# Patient Record
Sex: Female | Born: 1980 | Race: White | Hispanic: No | Marital: Married | Smoking: Current every day smoker
Health system: Southern US, Community
[De-identification: ages and names within clinical notes are randomized; demographics above are authoritative.]

## PROBLEM LIST (undated history)

## (undated) DIAGNOSIS — F419 Anxiety disorder, unspecified: Secondary | ICD-10-CM

## (undated) DIAGNOSIS — Z8489 Family history of other specified conditions: Secondary | ICD-10-CM

## (undated) DIAGNOSIS — J45909 Unspecified asthma, uncomplicated: Secondary | ICD-10-CM

## (undated) DIAGNOSIS — R011 Cardiac murmur, unspecified: Secondary | ICD-10-CM

## (undated) DIAGNOSIS — O1490 Unspecified pre-eclampsia, unspecified trimester: Secondary | ICD-10-CM

## (undated) DIAGNOSIS — D649 Anemia, unspecified: Secondary | ICD-10-CM

## (undated) DIAGNOSIS — K219 Gastro-esophageal reflux disease without esophagitis: Secondary | ICD-10-CM

---

## 2015-08-19 ENCOUNTER — Encounter: Payer: Self-pay | Admitting: Emergency Medicine

## 2015-08-19 ENCOUNTER — Emergency Department: Payer: 59

## 2015-08-19 ENCOUNTER — Emergency Department
Admission: EM | Admit: 2015-08-19 | Discharge: 2015-08-19 | Disposition: A | Discharge status: Home / Self Care | Payer: 59 | Attending: Emergency Medicine | Admitting: Emergency Medicine

## 2015-08-19 DIAGNOSIS — J069 Acute upper respiratory infection, unspecified: Secondary | ICD-10-CM | POA: Insufficient documentation

## 2015-08-19 DIAGNOSIS — J4521 Mild intermittent asthma with (acute) exacerbation: Secondary | ICD-10-CM

## 2015-08-19 DIAGNOSIS — J45901 Unspecified asthma with (acute) exacerbation: Secondary | ICD-10-CM | POA: Insufficient documentation

## 2015-08-19 DIAGNOSIS — Z720 Tobacco use: Secondary | ICD-10-CM | POA: Insufficient documentation

## 2015-08-19 DIAGNOSIS — R062 Wheezing: Secondary | ICD-10-CM | POA: Diagnosis present

## 2015-08-19 HISTORY — DX: Unspecified asthma, uncomplicated: J45.909

## 2015-08-19 HISTORY — DX: Unspecified pre-eclampsia, unspecified trimester: O14.90

## 2015-08-19 MED ORDER — HYDROCOD POLST-CPM POLST ER 10-8 MG/5ML PO SUER
ORAL | Status: AC
  Administered 2015-08-19: 5 mL via ORAL
  Filled 2015-08-19: qty 5

## 2015-08-19 MED ORDER — PREDNISONE 50 MG PO TABS: ORAL_TABLET | ORAL | Status: DC

## 2015-08-19 MED ORDER — PREDNISONE 20 MG PO TABS
60.0000 mg | ORAL_TABLET | Freq: Once | ORAL | Status: AC
  Administered 2015-08-19: 60 mg via ORAL

## 2015-08-19 MED ORDER — PREDNISONE 20 MG PO TABS
ORAL_TABLET | ORAL | Status: AC
  Administered 2015-08-19: 60 mg via ORAL
  Filled 2015-08-19: qty 3

## 2015-08-19 MED ORDER — IPRATROPIUM-ALBUTEROL 0.5-2.5 (3) MG/3ML IN SOLN
3.0000 mL | Freq: Once | RESPIRATORY_TRACT | Status: AC
  Administered 2015-08-19: 3 mL via RESPIRATORY_TRACT

## 2015-08-19 MED ORDER — HYDROCOD POLST-CPM POLST ER 10-8 MG/5ML PO SUER
5.0000 mL | Freq: Once | ORAL | Status: AC
  Administered 2015-08-19: 5 mL via ORAL

## 2015-08-19 MED ORDER — HYDROCOD POLST-CPM POLST ER 10-8 MG/5ML PO SUER: 5.0000 mL | Freq: Two times a day (BID) | ORAL | Status: DC

## 2015-08-19 MED ORDER — IPRATROPIUM-ALBUTEROL 0.5-2.5 (3) MG/3ML IN SOLN
RESPIRATORY_TRACT | Status: AC
  Administered 2015-08-19: 3 mL via RESPIRATORY_TRACT
  Filled 2015-08-19: qty 9

## 2015-08-19 NOTE — Discharge Instructions (Signed)
Asthma, Adult °Asthma is a recurring condition in which the airways tighten and narrow. Asthma can make it difficult to breathe. It can cause coughing, wheezing, and shortness of breath. Asthma episodes, also called asthma attacks, range from minor to life-threatening. Asthma cannot be cured, but medicines and lifestyle changes can help control it. °CAUSES °Asthma is believed to be caused by inherited (genetic) and environmental factors, but its exact cause is unknown. Asthma may be triggered by allergens, lung infections, or irritants in the air. Asthma triggers are different for each person. Common triggers include:  °· Animal dander. °· Dust mites. °· Cockroaches. °· Pollen from trees or grass. °· Mold. °· Smoke. °· Air pollutants such as dust, household cleaners, hair sprays, aerosol sprays, paint fumes, strong chemicals, or strong odors. °· Cold air, weather changes, and winds (which increase molds and pollens in the air). °· Strong emotional expressions such as crying or laughing hard. °· Stress. °· Certain medicines (such as aspirin) or types of drugs (such as beta-blockers). °· Sulfites in foods and drinks. Foods and drinks that may contain sulfites include dried fruit, potato chips, and sparkling grape juice. °· Infections or inflammatory conditions such as the flu, a cold, or an inflammation of the nasal membranes (rhinitis). °· Gastroesophageal reflux disease (GERD). °· Exercise or strenuous activity. °SYMPTOMS °Symptoms may occur immediately after asthma is triggered or many hours later. Symptoms include: °· Wheezing. °· Excessive nighttime or early morning coughing. °· Frequent or severe coughing with a common cold. °· Chest tightness. °· Shortness of breath. °DIAGNOSIS  °The diagnosis of asthma is made by a review of your medical history and a physical exam. Tests may also be performed. These may include: °· Lung function studies. These tests show how much air you breathe in and out. °· Allergy  tests. °· Imaging tests such as X-rays. °TREATMENT  °Asthma cannot be cured, but it can usually be controlled. Treatment involves identifying and avoiding your asthma triggers. It also involves medicines. There are 2 classes of medicine used for asthma treatment:  °· Controller medicines. These prevent asthma symptoms from occurring. They are usually taken every day. °· Reliever or rescue medicines. These quickly relieve asthma symptoms. They are used as needed and provide short-term relief. °Your health care provider will help you create an asthma action plan. An asthma action plan is a written plan for managing and treating your asthma attacks. It includes a list of your asthma triggers and how they may be avoided. It also includes information on when medicines should be taken and when their dosage should be changed. An action plan may also involve the use of a device called a peak flow meter. A peak flow meter measures how well the lungs are working. It helps you monitor your condition. °HOME CARE INSTRUCTIONS  °· Take medicines only as directed by your health care provider. Speak with your health care provider if you have questions about how or when to take the medicines. °· Use a peak flow meter as directed by your health care provider. Record and keep track of readings. °· Understand and use the action plan to help minimize or stop an asthma attack without needing to seek medical care. °· Control your home environment in the following ways to help prevent asthma attacks: °¨ Do not smoke. Avoid being exposed to secondhand smoke. °¨ Change your heating and air conditioning filter regularly. °¨ Limit your use of fireplaces and wood stoves. °¨ Get rid of pests (such as roaches   and mice) and their droppings. °¨ Throw away plants if you see mold on them. °¨ Clean your floors and dust regularly. Use unscented cleaning products. °¨ Try to have someone else vacuum for you regularly. Stay out of rooms while they are  being vacuumed and for a short while afterward. If you vacuum, use a dust mask from a hardware store, a double-layered or microfilter vacuum cleaner bag, or a vacuum cleaner with a HEPA filter. °¨ Replace carpet with wood, tile, or vinyl flooring. Carpet can trap dander and dust. °¨ Use allergy-proof pillows, mattress covers, and box spring covers. °¨ Wash bed sheets and blankets every week in hot water and dry them in a dryer. °¨ Use blankets that are made of polyester or cotton. °¨ Clean bathrooms and kitchens with bleach. If possible, have someone repaint the walls in these rooms with mold-resistant paint. Keep out of the rooms that are being cleaned and painted. °¨ Wash hands frequently. °SEEK MEDICAL CARE IF:  °· You have wheezing, shortness of breath, or a cough even if taking medicine to prevent attacks. °· The colored mucus you cough up (sputum) is thicker than usual. °· Your sputum changes from clear or white to yellow, green, gray, or bloody. °· You have any problems that may be related to the medicines you are taking (such as a rash, itching, swelling, or trouble breathing). °· You are using a reliever medicine more than 2-3 times per week. °· Your peak flow is still at 50-79% of your personal best after following your action plan for 1 hour. °· You have a fever. °SEEK IMMEDIATE MEDICAL CARE IF:  °· You seem to be getting worse and are unresponsive to treatment during an asthma attack. °· You are short of breath even at rest. °· You get short of breath when doing very little physical activity. °· You have difficulty eating, drinking, or talking due to asthma symptoms. °· You develop chest pain. °· You develop a fast heartbeat. °· You have a bluish color to your lips or fingernails. °· You are light-headed, dizzy, or faint. °· Your peak flow is less than 50% of your personal best. °  °This information is not intended to replace advice given to you by your health care provider. Make sure you discuss any  questions you have with your health care provider. °  °Document Released: 11/01/2005 Document Revised: 07/23/2015 Document Reviewed: 05/31/2013 °Elsevier Interactive Patient Education ©2016 [employer] °Upper Respiratory Infection, Adult °Most upper respiratory infections (URIs) are caused by a virus. A URI affects the nose, throat, and upper air passages. The most common type of URI is often called "the common cold." °HOME CARE  °· Take medicines only as told by your doctor. °· Gargle warm saltwater or take cough drops to comfort your throat as told by your doctor. °· Use a warm mist humidifier or inhale steam from a shower to increase air moisture. This may make it easier to breathe. °· Drink enough fluid to keep your pee (urine) clear or pale yellow. °· Eat soups and other clear broths. °· Have a healthy diet. °· Rest as needed. °· Go back to work when your fever is gone or your doctor says it is okay. °¨ You may need to stay home longer to avoid giving your URI to others. °¨ You can also wear a face mask and wash your hands often to prevent spread of the virus. °· Use your inhaler more if you have asthma. °· Do   not use any tobacco products, including cigarettes, chewing tobacco, or electronic cigarettes. If you need help quitting, ask your doctor. °GET HELP IF: °· You are getting worse, not better. °· Your symptoms are not helped by medicine. °· You have chills. °· You are getting more short of breath. °· You have brown or red mucus. °· You have yellow or brown discharge from your nose. °· You have pain in your face, especially when you bend forward. °· You have a fever. °· You have puffy (swollen) neck glands. °· You have pain while swallowing. °· You have white areas in the back of your throat. °GET HELP RIGHT AWAY IF:  °· You have very bad or constant: °¨ Headache. °¨ Ear pain. °¨ Pain in your forehead, behind your eyes, and over your cheekbones (sinus pain). °¨ Chest pain. °· You have long-lasting  (chronic) lung disease and any of the following: °¨ Wheezing. °¨ Long-lasting cough. °¨ Coughing up blood. °¨ A change in your usual mucus. °· You have a stiff neck. °· You have changes in your: °¨ Vision. °¨ Hearing. °¨ Thinking. °¨ Mood. °MAKE SURE YOU:  °· Understand these instructions. °· Will watch your condition. °· Will get help right away if you are not doing well or get worse. °  °This information is not intended to replace advice given to you by your health care provider. Make sure you discuss any questions you have with your health care provider. °  °Document Released: 04/19/2008 Document Revised: 03/18/2015 Document Reviewed: 02/06/2014 °Elsevier Interactive Patient Education ©2016 [employer] ° °

## 2015-08-19 NOTE — ED Provider Notes (Signed)
[facility] Emergency Department Provider Note     Time seen: ----------------------------------------- 9:49 PM on 08/19/2015 -----------------------------------------    I have reviewed the triage vital signs and the nursing notes.   HISTORY  Chief Complaint Wheezing and Cough    HPI Tanya Watts is a 34 y.o. female who presents to ER for asthma exacerbation. Patient's had recent cold symptoms with occasional productive cough and clear right sputum. She saw her primary care doctor today and was told she was having wheezing, was given Flovent today to use with Ventolin but she continues to have wheezing. She denies any other complaints.   Past Medical History  Diagnosis Date  . Asthma   . Preeclampsia     There are no active problems to display for this patient.   History reviewed. No pertinent past surgical history.  Allergies Review of patient's allergies indicates no known allergies.  Social History Social History  Substance Use Topics  . Smoking status: Current Every Day Smoker -- 2.00 packs/day    Types: Cigarettes  . Smokeless tobacco: None  . Alcohol Use: No    Review of Systems Constitutional: Negative for fever. Eyes: Negative for visual changes. ENT: Negative for sore throat. Cardiovascular: Negative for chest pain. Respiratory: Positive shortness of breath and cough. Gastrointestinal: Negative for abdominal pain, vomiting and diarrhea. Genitourinary: Negative for dysuria. Musculoskeletal: Negative for back pain. Skin: Negative for rash. Neurological: Negative for headaches, focal weakness or numbness.  10-point ROS otherwise negative.  ____________________________________________   PHYSICAL EXAM:  VITAL SIGNS: ED Triage Vitals  Enc Vitals Group     BP 08/19/15 2134 153/96 mmHg     Pulse Rate 08/19/15 2134 118     Resp 08/19/15 2134 20     Temp 08/19/15 2134 98.5 F (36.9 C)     Temp Source 08/19/15 2134  Oral     SpO2 08/19/15 2134 95 %     Weight 08/19/15 2134 184 lb (83.462 kg)     Height 08/19/15 2134  (1.676 m)     Head Cir --      Peak Flow --      Pain Score --      Pain Loc --      Pain Edu? --      Excl. in GC? --     Constitutional: Alert and oriented. Well appearing and in no distress. Eyes: Conjunctivae are normal. PERRL. Normal extraocular movements. ENT   Head: Normocephalic and atraumatic.   Nose: No congestion/rhinnorhea.   Mouth/Throat: Mucous membranes are moist.   Neck: No stridor. Cardiovascular: Normal rate, regular rhythm. Normal and symmetric distal pulses are present in all extremities. No murmurs, rubs, or gallops. Respiratory: Normal respiratory effort without tachypnea nor retractions. Bilateral wheezing is noted. Gastrointestinal: Soft and nontender. No distention. No abdominal bruits.  Musculoskeletal: Nontender with normal range of motion in all extremities. No joint effusions.  No lower extremity tenderness nor edema. Neurologic:  Normal speech and language. No gross focal neurologic deficits are appreciated. Speech is normal. No gait instability. Skin:  Skin is warm, dry and intact. No rash noted. Psychiatric: Mood and affect are normal. Speech and behavior are normal. Patient exhibits appropriate insight and judgment.  ____________________________________________  ED COURSE:  Pertinent labs & imaging results that were available during my care of the patient were reviewed by me and considered in my medical decision making (see chart for details). Patient being given duo nebs and oral prednisone here. Will reevaluate after  same. ____________________________________________    RADIOLOGY Images were viewed by me  Chest x-ray Is unremarkable ____________________________________________  FINAL ASSESSMENT AND PLAN  Acute asthma exacerbation  Plan: Patient with labs and imaging as dictated above. Likely URI with asthma  exacerbation. She'll continue on prednisone, we'll prescribe Tussionex to take as needed.   Emily Filbert, MD   Emily Filbert, MD 08/19/15 [phone]

## 2015-08-19 NOTE — ED Notes (Addendum)
Patient ambulatory to triage with steady gait, without difficulty or distress noted; pt reports hx asthma; recent cold symptoms with occas productive cough clear/white sputum; seen PCP today and told that she was having wheezing rx flovent today by PCP to use with ventolin but continues to have wheezing; resp even/unlab with diminished breath sounds and occas exp wheeze noted

## 2015-08-21 ENCOUNTER — Emergency Department
Admission: EM | Admit: 2015-08-21 | Discharge: 2015-08-21 | Discharge status: Against Medical Advice | Payer: 59 | Attending: Emergency Medicine | Admitting: Emergency Medicine

## 2015-08-21 ENCOUNTER — Encounter: Payer: Self-pay | Admitting: Emergency Medicine

## 2015-08-21 DIAGNOSIS — J45901 Unspecified asthma with (acute) exacerbation: Secondary | ICD-10-CM | POA: Diagnosis not present

## 2015-08-21 DIAGNOSIS — Z720 Tobacco use: Secondary | ICD-10-CM | POA: Insufficient documentation

## 2015-08-21 DIAGNOSIS — R0602 Shortness of breath: Secondary | ICD-10-CM | POA: Diagnosis present

## 2015-08-21 NOTE — ED Notes (Signed)
Pt came in [facility] on 5/3 for this same shortness of breath and was given  prednisone, as well as nebulizer treatment which worked very well for her.  At home, she uses rescue inhaler but feels like her airway is tight about an hour after.  She does have a nebulizer on order but does not have it at her home yet.

## 2015-11-19 ENCOUNTER — Emergency Department
Admission: EM | Admit: 2015-11-19 | Discharge: 2015-11-19 | Disposition: A | Discharge status: Home / Self Care | Payer: 59 | Attending: Emergency Medicine | Admitting: Emergency Medicine

## 2015-11-19 ENCOUNTER — Encounter: Payer: Self-pay | Admitting: Emergency Medicine

## 2015-11-19 DIAGNOSIS — F17210 Nicotine dependence, cigarettes, uncomplicated: Secondary | ICD-10-CM | POA: Insufficient documentation

## 2015-11-19 DIAGNOSIS — J4521 Mild intermittent asthma with (acute) exacerbation: Secondary | ICD-10-CM | POA: Diagnosis not present

## 2015-11-19 DIAGNOSIS — Z7952 Long term (current) use of systemic steroids: Secondary | ICD-10-CM | POA: Diagnosis not present

## 2015-11-19 DIAGNOSIS — Z79899 Other long term (current) drug therapy: Secondary | ICD-10-CM | POA: Diagnosis not present

## 2015-11-19 DIAGNOSIS — J300 Vasomotor rhinitis: Secondary | ICD-10-CM | POA: Insufficient documentation

## 2015-11-19 DIAGNOSIS — R05 Cough: Secondary | ICD-10-CM | POA: Diagnosis present

## 2015-11-19 MED ORDER — IPRATROPIUM-ALBUTEROL 0.5-2.5 (3) MG/3ML IN SOLN
3.0000 mL | Freq: Once | RESPIRATORY_TRACT | Status: AC
  Administered 2015-11-19: 3 mL via RESPIRATORY_TRACT

## 2015-11-19 MED ORDER — FLUTICASONE PROPIONATE 50 MCG/ACT NA SUSP: 1.0000 | Freq: Every day | NASAL | Status: DC

## 2015-11-19 MED ORDER — IPRATROPIUM-ALBUTEROL 0.5-2.5 (3) MG/3ML IN SOLN
RESPIRATORY_TRACT | Status: AC
  Administered 2015-11-19: 3 mL via RESPIRATORY_TRACT
  Filled 2015-11-19: qty 3

## 2015-11-19 NOTE — ED Provider Notes (Signed)
[facility] Emergency Department Provider Note ____________________________________________  Time seen: 0716  I have reviewed the triage vital signs and the nursing notes.  HISTORY  Chief Complaint  Asthma  HPI Tanya Watts is a 35 y.o. female presents to the ED for an acute flare of her asthma with onset this morning while asleep. She describes that about 4:45 AM she developed a nonproductive cough and shortness of breath. She denies any interim admit to fevers, chills, or sweats. She also denies any recent upper respiratory symptoms. She describes symptoms are not improved with a single dose of her albuterol inhaler this morning.  Past Medical History  Diagnosis Date  . Asthma   . Preeclampsia     There are no active problems to display for this patient.   History reviewed. No pertinent past surgical history.  Current Outpatient Rx  Name  Route  Sig  Dispense  Refill  . citalopram (CELEXA) 20 MG tablet   Oral   Take 20 mg by mouth daily.         Marland Kitchen albuterol (PROVENTIL HFA;VENTOLIN HFA) 108 (90 BASE) MCG/ACT inhaler   Inhalation   Inhale 2 puffs into the lungs every 4 (four) hours as needed for wheezing or shortness of breath.         . chlorpheniramine-HYDROcodone (TUSSIONEX PENNKINETIC ER) 10-8 MG/5ML SUER   Oral   Take 5 mLs by mouth 2 (two) times daily.   140 mL   0   . fluticasone (FLONASE) 50 MCG/ACT nasal spray   Each Nare   Place 1 spray into both nostrils daily.   16 g   0   . fluticasone (FLOVENT HFA) 220 MCG/ACT inhaler   Inhalation   Inhale 2 puffs into the lungs 2 (two) times daily.         Marland Kitchen loratadine (CLARITIN) 10 MG tablet   Oral   Take 10 mg by mouth daily.         . predniSONE (DELTASONE) 50 MG tablet      One tablet by mouth daily   4 tablet   0    Allergies Review of patient's allergies indicates no known allergies.  No family history on file.  Social History Social History  Substance Use  Topics  . Smoking status: Current Every Day Smoker -- 1.00 packs/day    Types: Cigarettes  . Smokeless tobacco: None  . Alcohol Use: No   Review of Systems  Constitutional: Negative for fever. Eyes: Negative for visual changes. ENT: Negative for sore throat. Cardiovascular: Negative for chest pain. Respiratory: Positive for shortness of breath. Gastrointestinal: Negative for abdominal pain, vomiting and diarrhea. Genitourinary: Negative for dysuria. Musculoskeletal: Negative for back pain. Skin: Negative for rash. Neurological: Negative for headaches, focal weakness or numbness. ____________________________________________  PHYSICAL EXAM:  VITAL SIGNS: ED Triage Vitals  Enc Vitals Group     BP 11/19/15 0620 166/116 mmHg     Pulse Rate 11/19/15 0620 113     Resp 11/19/15 0620 18     Temp 11/19/15 0620 97.8 F (36.6 C)     Temp Source 11/19/15 0620 Oral     SpO2 11/19/15 0620 100 %     Weight 11/19/15 0620 194 lb (87.998 kg)     Height 11/19/15 0620 5\' 6"  (1.676 m)     Head Cir --      Peak Flow --      Pain Score --      Pain Loc --  Pain Edu? --      Excl. in GC? --    Constitutional: Alert and oriented. Well appearing and in no distress. Head: Normocephalic and atraumatic.      Eyes: Conjunctivae are normal. PERRL. Normal extraocular movements      Ears: Canals clear. TMs intact bilaterally.   Nose: No congestion. Mild rhinorrhea.   Mouth/Throat: Mucous membranes are moist.   Neck: Supple. No thyromegaly. Hematological/Lymphatic/Immunological: No cervical lymphadenopathy. Cardiovascular: Normal rate, regular rhythm.  Respiratory: Normal respiratory effort. No wheezes/rales/rhonchi. Gastrointestinal: Soft and nontender. No distention. Musculoskeletal: Nontender with normal range of motion in all extremities.  Neurologic:  Normal gait without ataxia. Normal speech and language. No gross focal neurologic deficits are appreciated. Skin:  Skin is warm,  dry and intact. No rash noted. Psychiatric: Mood and affect are normal. Patient exhibits appropriate insight and judgment. ____________________________________________  PROCEDURES  DuoNeb x 1 ____________________________________________  INITIAL IMPRESSION / ASSESSMENT AND PLAN / ED COURSE  Patient with an acute vasomotor rhinitis and flare of mild intermittent asthma. Her symptoms may be more consistent with postnasal drainage then out right bronchospasm. Patient is however encouraged to dose her albuterol every 15 minutes as needed for acute flares. She is also advised to dose over-the-counter allergy medicines as needed. She'll follow with primary care provider for ongoing symptoms. ____________________________________________  FINAL CLINICAL IMPRESSION(S) / ED DIAGNOSES  Final diagnoses:  Vasomotor rhinitis  Acute asthma flare, mild intermittent      Lissa HoardJenise V Bacon Odell Choung, PA-C 11/19/15 [phone]  Minna AntisKevin Paduchowski, MD 11/19/15 1515

## 2015-11-19 NOTE — ED Notes (Signed)
Pt lungs clear, reports feeling better after receiving nebulizer.  Educated patient on smoking cessation and proper inhaler use. Encouraged to follow up with PCP.

## 2015-11-19 NOTE — ED Notes (Addendum)
Patient ambulatory to triage with steady gait, without difficulty or distress noted; pt reports awoke with asthma attack, nonprod cough; used inhaler (albuterol) with only slight relief; denies any recent illness; resp even/unlab, lungs clear

## 2015-11-19 NOTE — Discharge Instructions (Signed)
Allergic Rhinitis Allergic rhinitis is when the mucous membranes in the nose respond to allergens. Allergens are particles in the air that cause your body to have an allergic reaction. This causes you to release allergic antibodies. Through a chain of events, these eventually cause you to release histamine into the blood stream. Although meant to protect the body, it is this release of histamine that causes your discomfort, such as frequent sneezing, congestion, and an itchy, runny nose.  CAUSES Seasonal allergic rhinitis (hay fever) is caused by pollen allergens that may come from grasses, trees, and weeds. Year-round allergic rhinitis (perennial allergic rhinitis) is caused by allergens such as house dust mites, pet dander, and mold spores. SYMPTOMS  Nasal stuffiness (congestion).  Itchy, runny nose with sneezing and tearing of the eyes. DIAGNOSIS Your health care provider can help you determine the allergen or allergens that trigger your symptoms. If you and your health care provider are unable to determine the allergen, skin or blood testing may be used. Your health care provider will diagnose your condition after taking your health history and performing a physical exam. Your health care provider may assess you for other related conditions, such as asthma, pink eye, or an ear infection. TREATMENT Allergic rhinitis does not have a cure, but it can be controlled by:  Medicines that block allergy symptoms. These may include allergy shots, nasal sprays, and oral antihistamines.  Avoiding the allergen. Hay fever may often be treated with antihistamines in pill or nasal spray forms. Antihistamines block the effects of histamine. There are over-the-counter medicines that may help with nasal congestion and swelling around the eyes. Check with your health care provider before taking or giving this medicine. If avoiding the allergen or the medicine prescribed do not work, there are many new medicines  your health care provider can prescribe. Stronger medicine may be used if initial measures are ineffective. Desensitizing injections can be used if medicine and avoidance does not work. Desensitization is when a patient is given ongoing shots until the body becomes less sensitive to the allergen. Make sure you follow up with your health care provider if problems continue. HOME CARE INSTRUCTIONS It is not possible to completely avoid allergens, but you can reduce your symptoms by taking steps to limit your exposure to them. It helps to know exactly what you are allergic to so that you can avoid your specific triggers. SEEK MEDICAL CARE IF:  You have a fever.  You develop a cough that does not stop easily (persistent).  You have shortness of breath.  You start wheezing.  Symptoms interfere with normal daily activities.   This information is not intended to replace advice given to you by your health care provider. Make sure you discuss any questions you have with your health care provider.   Document Released: 07/27/2001 Document Revised: 11/22/2014 Document Reviewed: 07/09/2013 Elsevier Interactive Patient Education 2016 [place] Prevention While you may not be able to control the fact that you have asthma, you can take actions to prevent asthma attacks. The best way to prevent asthma attacks is to maintain good control of your asthma. You can achieve this by:  Taking your medicines as directed.  Avoiding things that can irritate your airways or make your asthma symptoms worse (asthma triggers).  Keeping track of how well your asthma is controlled and of any changes in your symptoms.  Responding quickly to worsening asthma symptoms (asthma attack).  Seeking emergency care when it is needed. WHAT  ARE SOME WAYS TO PREVENT AN ASTHMA ATTACK? Have a Plan Work with your health care provider to create a written plan for managing and treating your asthma attacks (asthma  action plan). This plan includes:  A list of your asthma triggers and how you can avoid them.  Information on when medicines should be taken and when their dosages should be changed.  The use of a device that measures how well your lungs are working (peak flow meter). Monitor Your Asthma Use your peak flow meter and record your results in a journal every day. A drop in your peak flow numbers on one or more days may indicate the start of an asthma attack. This can happen even before you start to feel symptoms. You can prevent an asthma attack from getting worse by following the steps in your asthma action plan. Avoid Asthma Triggers Work with your asthma health care provider to find out what your asthma triggers are. This can be done by:  Allergy testing.  Keeping a journal that notes when asthma attacks occur and the factors that may have contributed to them.  Determining if there are other medical conditions that are making your asthma worse. Once you have determined your asthma triggers, take steps to avoid them. This may include avoiding excessive or prolonged exposure to:  Dust. Have someone dust and vacuum your home for you once or twice a week. Using a high-efficiency particulate arrestance (HEPA) vacuum is best.  Smoke. This includes campfire smoke, forest fire smoke, and secondhand smoke from tobacco products.  Pet dander. Avoid contact with animals that you know you are allergic to.  Allergens from trees, grasses or pollens. Avoid spending a lot of time outdoors when pollen counts are high, and on very windy days.  Very cold, dry, or humid air.  Mold.  Foods that contain high amounts of sulfites.  Strong odors.  Outdoor air pollutants, such as [employer]ic[employer].  Indoor air pollutants, such as aerosol sprays and fumes from household cleaners.  Household pests, including dust mites and cockroaches, and pest droppings.  Certain medicines, including NSAIDs. Always talk to  your health care provider before stopping or starting any new medicines. Medicines Take over-the-counter and prescription medicines only as told by your health care provider. Many asthma attacks can be prevented by carefully following your medicine schedule. Taking your medicines correctly is especially important when you cannot avoid certain asthma triggers. Act Quickly If an asthma attack does happen, acting quickly can decrease how severe it is and how long it lasts. Take these steps:   Pay attention to your symptoms. If you are coughing, wheezing, or having difficulty breathing, do not wait to see if your symptoms go away on their own. Follow your asthma action plan.  If you have followed your asthma action plan and your symptoms are not improving, call your health care provider or seek immediate medical care at the nearest hospital. It is important to note how often you need to use your fast-acting rescue inhaler. If you are using your rescue inhaler more often, it may mean that your asthma is not under control. Adjusting your asthma treatment plan may help you to prevent future asthma attacks and help you to gain better control of your condition. HOW CAN I PREVENT AN ASTHMA ATTACK WHEN I EXERCISE? Follow advice from your health care provider about whether you should use your fast-acting inhaler before exercising. Many people with asthma experience exercise-induced bronchoconstriction (EIB). This condition often worsens during  vigorous exercise in cold, humid, or dry environments. Usually, people with EIB can stay very active by pre-treating with a fast-acting inhaler before exercising.   This information is not intended to replace advice given to you by your health care provider. Make sure you discuss any questions you have with your health care provider.   Document Released: 10/20/2009 Document Revised: 07/23/2015 Document Reviewed: 04/03/2015 Elsevier Interactive Patient Education 2016  [employer]  Continue to dose your home medicines for asthma and allergy. Dose your albuterol inhaler every 15 minutes for an acute attack.

## 2015-11-19 NOTE — ED Notes (Signed)
Pt took inhaler around 445 this morning and did not feel much relief. Pt has history of asthma. Denies any recent URI or other illness. Has been able to cough up some since using inhaler, but not a lot per patient.

## 2017-06-15 IMAGING — CR DG CHEST 2V
1 series · 2 of 2 positions shown · non-contrast
Comparison: None.

CLINICAL DATA: Acute onset of generalized chest tightness and
cough. Initial encounter.

EXAM:
CHEST  2 VIEW

[Series 1: dg chest 2 view · 0.14mm/px · 2 of 2 slices shown]
[im 1/2]
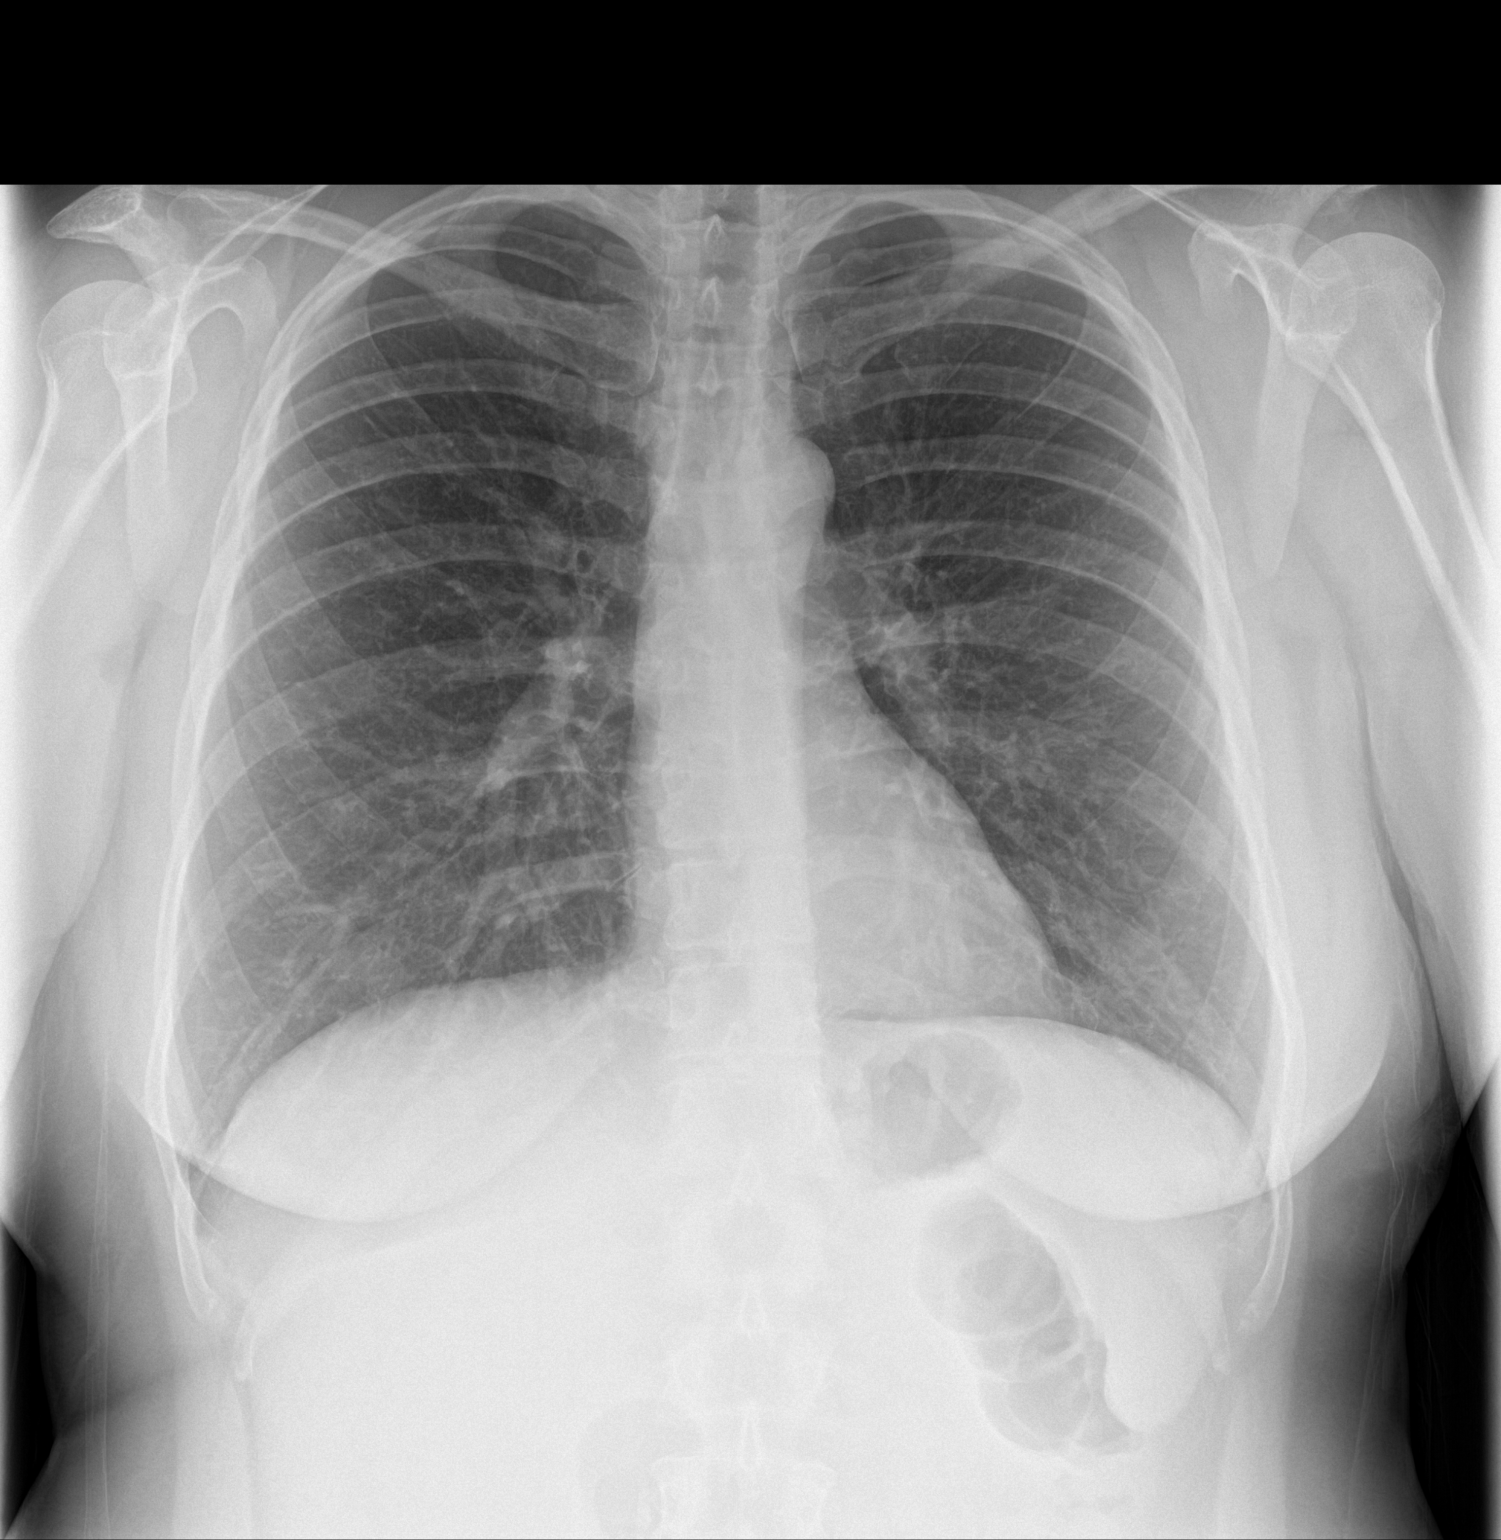
[im 2/2]
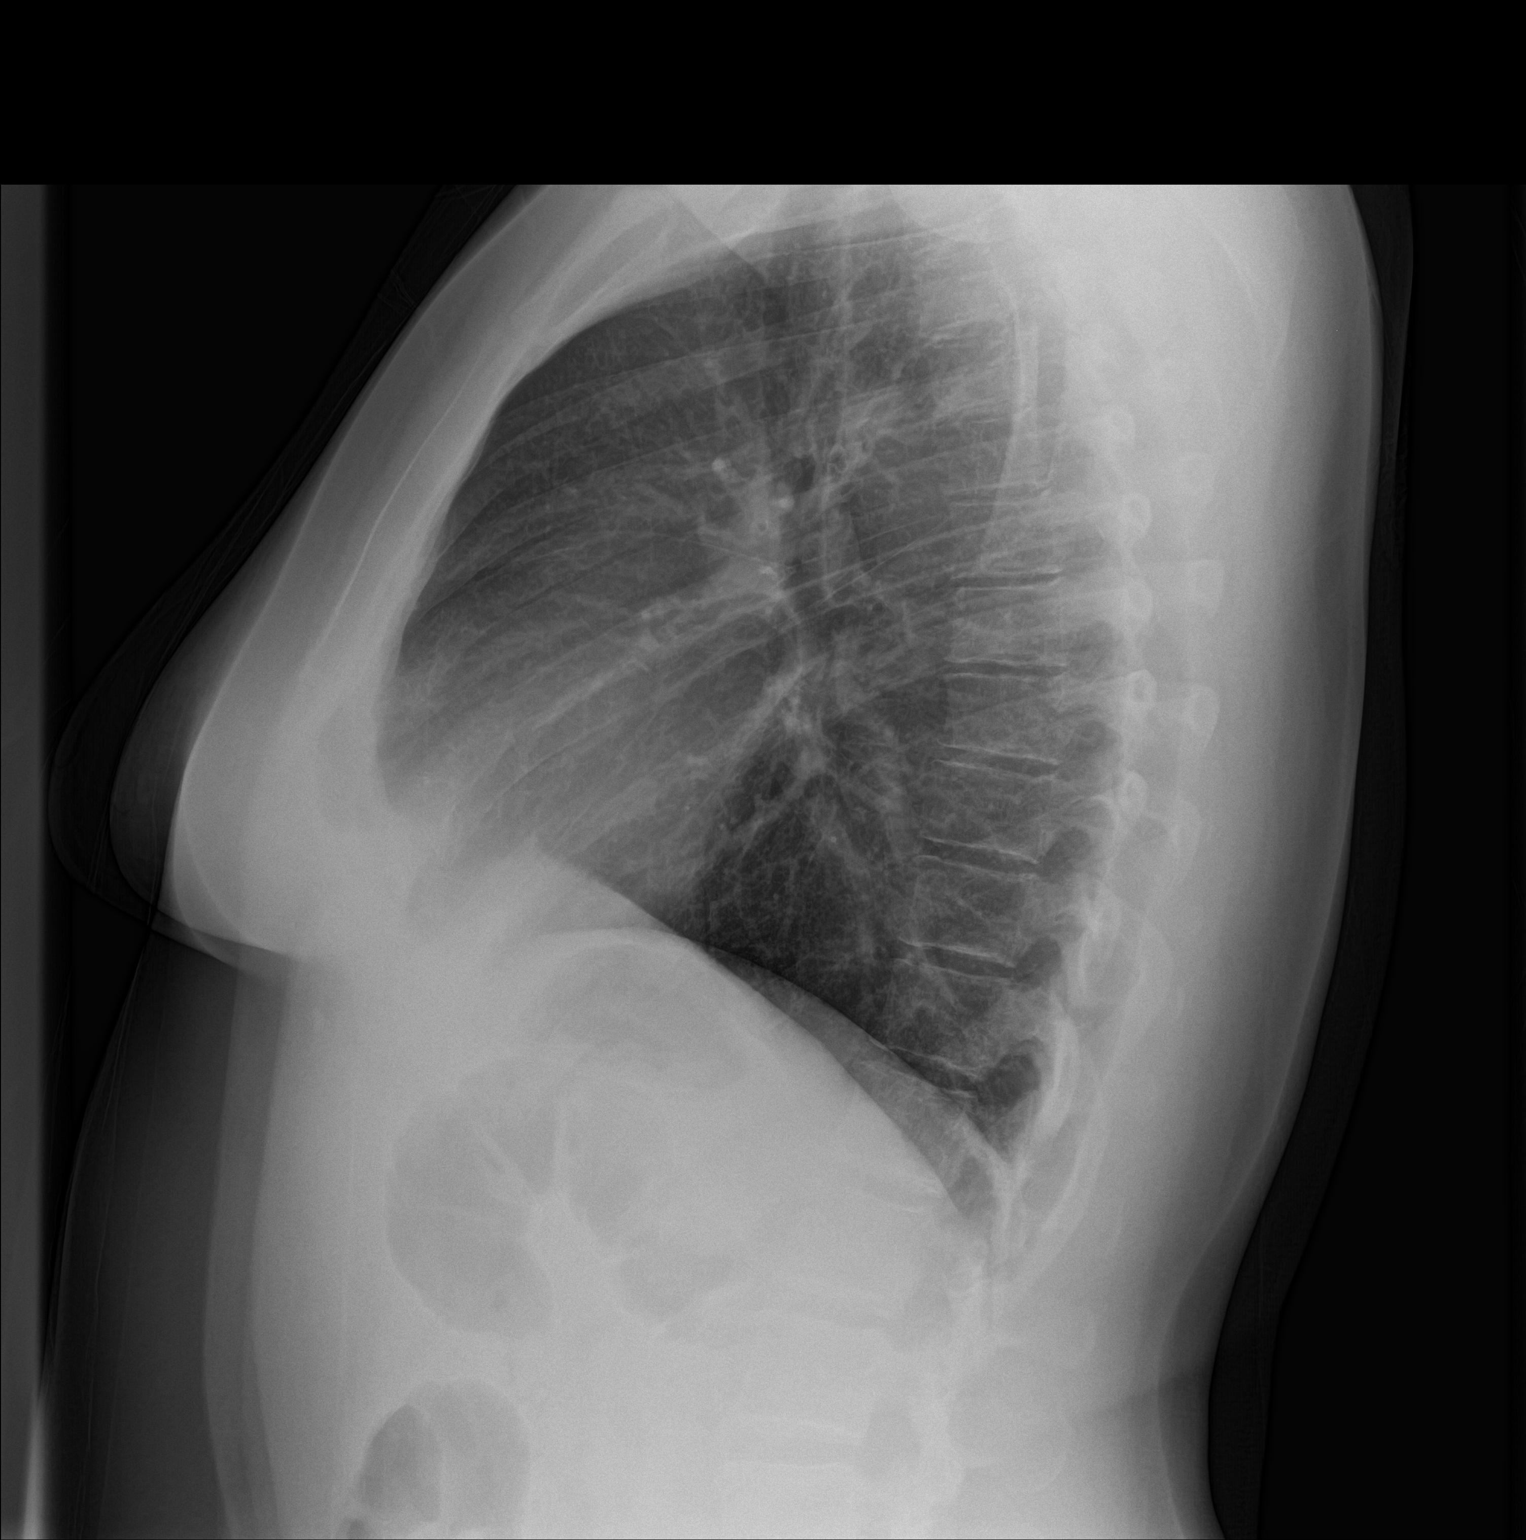

[2 of 2 positions shown; findings below may reference images not displayed]

FINDINGS: The lungs are well-aerated and clear. There is no evidence of focal
opacification, pleural effusion or pneumothorax.

The heart is normal in size; the mediastinal contour is within
normal limits. No acute osseous abnormalities are seen.
IMPRESSION: No acute cardiopulmonary process seen.

## 2019-07-11 ENCOUNTER — Other Ambulatory Visit: Payer: Self-pay

## 2019-07-11 DIAGNOSIS — Z20822 Contact with and (suspected) exposure to covid-19: Secondary | ICD-10-CM

## 2019-07-12 LAB — NOVEL CORONAVIRUS, NAA: SARS-CoV-2, NAA: NOT DETECTED

## 2019-12-23 ENCOUNTER — Ambulatory Visit: Payer: Medicaid Other | Attending: Internal Medicine

## 2019-12-23 DIAGNOSIS — Z23 Encounter for immunization: Secondary | ICD-10-CM | POA: Insufficient documentation

## 2019-12-23 NOTE — Progress Notes (Signed)
   Covid-19 Vaccination Clinic  Name:  Tanya Watts    MRN: [id number] DOB: 10-03-1981  12/23/2019  [name] was observed post Covid-19 immunization for 15 minutes without incidence. She was provided with Vaccine Information Sheet and instruction to access the V-Safe system.   [name] was instructed to call 911 with any severe reactions post vaccine: Marland Kitchen Difficulty breathing  . Swelling of your face and throat  . A fast heartbeat  . A bad rash all over your body  . Dizziness and weakness    Immunizations Administered    Name Date Dose VIS Date Route   Pfizer COVID-19 Vaccine 12/23/2019 10:09 AM 0.3 mL 10/26/2019 Intramuscular   Manufacturer: [employer], [employer]   Lot: [id number]   NDC: [address]-5

## 2020-01-16 ENCOUNTER — Ambulatory Visit: Payer: Medicaid Other | Attending: Internal Medicine

## 2020-01-16 DIAGNOSIS — Z23 Encounter for immunization: Secondary | ICD-10-CM | POA: Insufficient documentation

## 2020-01-16 NOTE — Progress Notes (Signed)
   Covid-19 Vaccination Clinic  Name:  Tanya Watts    MRN: [id number] DOB: 05-Apr-1981  01/16/2020  [name] was observed post Covid-19 immunization for 15 minutes without incident. She was provided with Vaccine Information Sheet and instruction to access the V-Safe system.   [name] was instructed to call 911 with any severe reactions post vaccine: Marland Kitchen Difficulty breathing  . Swelling of face and throat  . A fast heartbeat  . A bad rash all over body  . Dizziness and weakness   Immunizations Administered    Name Date Dose VIS Date Route   Pfizer COVID-19 Vaccine 01/16/2020  9:20 AM 0.3 mL 10/26/2019 Intramuscular   Manufacturer: [employer], [employer]   Lot: [id number]   NDC: [address]-6

## 2020-01-31 ENCOUNTER — Other Ambulatory Visit: Payer: Self-pay

## 2020-01-31 ENCOUNTER — Ambulatory Visit: Payer: 59 | Admitting: Surgery

## 2020-01-31 ENCOUNTER — Encounter: Payer: Self-pay | Admitting: Surgery

## 2020-01-31 VITALS — BP 125/73 | HR 94 | Temp 98.1°F | Ht 66.0 in | Wt 177.6 lb

## 2020-01-31 DIAGNOSIS — K8010 Calculus of gallbladder with chronic cholecystitis without obstruction: Secondary | ICD-10-CM | POA: Diagnosis not present

## 2020-01-31 NOTE — Patient Instructions (Addendum)
Dr.Rodenberg discusses surgical treatment with patient at today's visit.  Our surgery scheduler Marzetta Board will contact you within the next 24-48 hours. During that call, Marzetta Board will discuss the preparation prior to surgery and she will also discuss the different dates and times for surgery. Please have the BLUE sheet available when she contacts you. If you have any questions or concerns, please feel free to contact our office.    Laparoscopic Cholecystectomy Laparoscopic cholecystectomy is surgery to remove the gallbladder. The gallbladder is a pear-shaped organ that lies beneath the liver on the right side of the body. The gallbladder stores bile, which is a fluid that helps the body to digest fats. Cholecystectomy is often done for inflammation of the gallbladder (cholecystitis). This condition is usually caused by a buildup of gallstones (cholelithiasis) in the gallbladder. Gallstones can block the flow of bile, which can result in inflammation and pain. In severe cases, emergency surgery may be required. This procedure is done though small incisions in your abdomen (laparoscopic surgery). A thin scope with a camera (laparoscope) is inserted through one incision. Thin surgical instruments are inserted through the other incisions. In some cases, a laparoscopic procedure may be turned into a type of surgery that is done through a larger incision (open surgery). Tell a health care provider about: Any allergies you have. All medicines you are taking, including vitamins, herbs, eye drops, creams, and over-the-counter medicines. Any problems you or family members have had with anesthetic medicines. Any blood disorders you have. Any surgeries you have had. Any medical conditions you have. Whether you are pregnant or may be pregnant. What are the risks? Generally, this is a safe procedure. However, problems may occur, including: Infection. Bleeding. Allergic reactions to medicines. Damage to other  structures or organs. A stone remaining in the common bile duct. The common bile duct carries bile from the gallbladder into the small intestine. A bile leak from the cyst duct that is clipped when your gallbladder is removed. What happens before the procedure? Staying hydrated Follow instructions from your health care provider about hydration, which may include: Up to 2 hours before the procedure - you may continue to drink clear liquids, such as water, clear fruit juice, black coffee, and plain tea. Eating and drinking restrictions Follow instructions from your health care provider about eating and drinking, which may include: 8 hours before the procedure - stop eating heavy meals or foods such as meat, fried foods, or fatty foods. 6 hours before the procedure - stop eating light meals or foods, such as toast or cereal. 6 hours before the procedure - stop drinking milk or drinks that contain milk. 2 hours before the procedure - stop drinking clear liquids. Medicines Ask your health care provider about: Changing or stopping your regular medicines. This is especially important if you are taking diabetes medicines or blood thinners. Taking medicines such as aspirin and ibuprofen. These medicines can thin your blood. Do not take these medicines before your procedure if your health care provider instructs you not to. You may be given antibiotic medicine to help prevent infection. General instructions Let your health care provider know if you develop a cold or an infection before surgery. Plan to have someone take you home from the hospital or clinic. Ask your health care provider how your surgical site will be marked or identified. What happens during the procedure?  To reduce your risk of infection: Your health care team will wash or sanitize their hands. Your skin will be washed  with soap. Hair may be removed from the surgical area. An IV tube may be inserted into one of your veins. You  will be given one or more of the following: A medicine to help you relax (sedative). A medicine to make you fall asleep (general anesthetic). A breathing tube will be placed in your mouth. Your surgeon will make several small cuts (incisions) in your abdomen. The laparoscope will be inserted through one of the small incisions. The camera on the laparoscope will send images to a TV screen (monitor) in the operating room. This lets your surgeon see inside your abdomen. Air-like gas will be pumped into your abdomen. This will expand your abdomen to give the surgeon more room to perform the surgery. Other tools that are needed for the procedure will be inserted through the other incisions. The gallbladder will be removed through one of the incisions. Your common bile duct may be examined. If stones are found in the common bile duct, they may be removed. After your gallbladder has been removed, the incisions will be closed with stitches (sutures), staples, or skin glue. Your incisions may be covered with a bandage (dressing). The procedure may vary among health care providers and hospitals. What happens after the procedure? Your blood pressure, heart rate, breathing rate, and blood oxygen level will be monitored until the medicines you were given have worn off. You will be given medicines as needed to control your pain. Do not drive for 24 hours if you were given a sedative. This information is not intended to replace advice given to you by your health care provider. Make sure you discuss any questions you have with your health care provider. Document Revised: 10/14/2017 Document Reviewed: 04/19/2016 Elsevier Patient Education  2020 [employer].

## 2020-01-31 NOTE — Progress Notes (Signed)
Patient ID: SHIZUKO WOJDYLA, female   DOB: 1981/07/16, 39 y.o.   MRN: [id number]  Chief Complaint: Symptomatic gallstones  History of Present Illness Tanya Watts is a 39 y.o. female with multiple mobile gallstones noted on ultrasound from March 4.  Patient has a longstanding history of postprandial right upper quadrant epigastric pain with radiation into the sternal region also across the right costal margin and into the right scapular region.  She has approximately 2-3 attacks per week.  She has had associated nausea and vomiting.  She has had associated soft loose stools.  Its been worse over the last month but possibly longer.   Past Medical History Past Medical History:  Diagnosis Date  . Asthma   . Preeclampsia       History reviewed. No pertinent surgical history.  Allergies  Allergen Reactions  . Yellow Fever Vaccine     Current Outpatient Medications  Medication Sig Dispense Refill  . acetaminophen (TYLENOL) 500 MG tablet Take by mouth.    Marland Kitchen albuterol (PROVENTIL HFA;VENTOLIN HFA) 108 (90 BASE) MCG/ACT inhaler Inhale 2 puffs into the lungs every 4 (four) hours as needed for wheezing or shortness of breath.    . cetirizine (ZYRTEC) 10 MG tablet Take by mouth.    . fluticasone (FLONASE) 50 MCG/ACT nasal spray Place 1 spray into both nostrils daily. 16 g 0  . ibuprofen (ADVIL) 600 MG tablet ibuprofen 600 mg tablet    . Influenza Virus Vacc Split PF 0.5 ML SUSY Afluria 2015-2016 (PF) 45 mcg (15 mcg x 3)/0.5 mL IM syringe  TO BE ADMINISTERED BY A PHARMACIST    . pantoprazole (PROTONIX) 40 MG tablet Take by mouth.    . Respiratory Therapy Supplies (NEBULIZER/TUBING/MOUTHPIECE) KIT by Does not apply route.    . chlorpheniramine-HYDROcodone (TUSSIONEX PENNKINETIC ER) 10-8 MG/5ML SUER Take 5 mLs by mouth 2 (two) times daily. (Patient not taking: Reported on 01/31/2020) 140 mL 0  . citalopram (CELEXA) 20 MG tablet Take 20 mg by mouth daily.    . fluticasone (FLOVENT HFA) 220  MCG/ACT inhaler Inhale 2 puffs into the lungs 2 (two) times daily.    Marland Kitchen loratadine (CLARITIN) 10 MG tablet Take 10 mg by mouth daily.    . predniSONE (DELTASONE) 50 MG tablet One tablet by mouth daily (Patient not taking: Reported on 01/31/2020) 4 tablet 0   No current facility-administered medications for this visit.    Family History Family History  Problem Relation Age of Onset  . Diabetes Mother   . Supraventricular tachycardia Mother   . Thyroid disease Mother       Social History Social History   Tobacco Use  . Smoking status: Current Every Day Smoker    Packs/day: 1.00    Types: Cigarettes  . Smokeless tobacco: Never Used  Substance Use Topics  . Alcohol use: No  . Drug use: Never        Review of Systems  Constitutional: Positive for malaise/fatigue. Negative for weight loss.  HENT: Positive for sinus pain.   Respiratory: Positive for wheezing.   Gastrointestinal: Negative for blood in stool, constipation and melena.  Genitourinary: Negative.   Musculoskeletal: Negative.   Skin: Negative for itching and rash.  Neurological: Negative.   Endo/Heme/Allergies: Negative.       Physical Exam Blood pressure 125/73, pulse 94, temperature 98.1 F (36.7 C), temperature source Temporal, height 5' 6"  (1.676 m), weight 177 lb 9.6 oz (80.6 kg), SpO2 98 %. Last Weight  Most recent update:  01/31/2020  3:12 PM   Weight  80.6 kg (177 lb 9.6 oz)            CONSTITUTIONAL: Well developed, and nourished, appropriately responsive and aware without distress.   EYES: Sclera non-icteric.   EARS, NOSE, MOUTH AND THROAT: Mask worn.  Hearing is intact to voice.  NECK: Trachea is midline, and there is no jugular venous distension.  LYMPH NODES:  Lymph nodes in the neck are not enlarged. RESPIRATORY:  Lungs are clear, and breath sounds are equal bilaterally. Normal respiratory effort without pathologic use of accessory muscles. CARDIOVASCULAR: Heart is regular in rate and  rhythm. GI: The abdomen is soft, nontender, and nondistended. There were no palpable masses. I did not appreciate hepatosplenomegaly. There were normal bowel sounds.  MUSCULOSKELETAL:  Symmetrical muscle tone appreciated in all four extremities.    SKIN: Skin turgor is normal. No pathologic skin lesions appreciated.  NEUROLOGIC:  Motor and sensation appear grossly normal.  Cranial nerves are grossly without defect. PSYCH:  Alert and oriented to person, place and time. Affect is appropriate for situation.  Data Reviewed I have personally reviewed what is currently available of the patient's imaging, recent labs and medical records.   Labs:  No flowsheet data found. No flowsheet data found.    Imaging: Radiology review: I have a radiology report from [place] dated January 17, 2020.  Ultrasound of the gallbladder shows normal wall thickness of 1 mm.  No pericholecystic fluid identified.  Multiple mobile gallstones, the largest 5 mm diameter.  Technologist reported mild tenderness over the gallbladder on directed palpation.  Common bile duct is normal in size, 2 mm.  Imaged portions of the liver are unremarkable. Within last 24 hrs: No results found.  Assessment    Chronic calculus cholecystitis/biliary colic. There are no problems to display for this patient.   Plan    Robotic cholecystectomy. The risks, benefits, complications, treatment options, and expected outcomes were discussed with the patient. The possibilities of bleeding, recurrent infection, finding a normal gallbladder, perforation of viscus organs, damage to surrounding structures, bile leak, abscess formation, needing a drain placed, the need for additional procedures, reaction to medication, pulmonary aspiration,  failure to diagnose a condition, the possible need to convert to an open procedure, and creating a complication requiring transfusion or operation were discussed with the patient. The patient  and/or family concurred with the proposed plan, giving informed consent.    Face-to-face time spent with the patient and accompanying care providers(if present) was 30 minutes, with more than 50% of the time spent counseling, educating, and coordinating care of the patient.      Ronny Bacon M.D., FACS 01/31/2020, 5:10 PM

## 2020-01-31 NOTE — H&P (View-Only) (Signed)
Patient ID: Tanya Watts, female   DOB: Oct 09, 1981, 39 y.o.   MRN: [id number]  Chief Complaint: Symptomatic gallstones  History of Present Illness Tanya Watts is a 38 y.o. female with multiple mobile gallstones noted on ultrasound from March 4.  Patient has a longstanding history of postprandial right upper quadrant epigastric pain with radiation into the sternal region also across the right costal margin and into the right scapular region.  She has approximately 2-3 attacks per week.  She has had associated nausea and vomiting.  She has had associated soft loose stools.  Its been worse over the last month but possibly longer.   Past Medical History Past Medical History:  Diagnosis Date  . Asthma   . Preeclampsia       History reviewed. No pertinent surgical history.  Allergies  Allergen Reactions  . Yellow Fever Vaccine     Current Outpatient Medications  Medication Sig Dispense Refill  . acetaminophen (TYLENOL) 500 MG tablet Take by mouth.    Marland Kitchen albuterol (PROVENTIL HFA;VENTOLIN HFA) 108 (90 BASE) MCG/ACT inhaler Inhale 2 puffs into the lungs every 4 (four) hours as needed for wheezing or shortness of breath.    . cetirizine (ZYRTEC) 10 MG tablet Take by mouth.    . fluticasone (FLONASE) 50 MCG/ACT nasal spray Place 1 spray into both nostrils daily. 16 g 0  . ibuprofen (ADVIL) 600 MG tablet ibuprofen 600 mg tablet    . Influenza Virus Vacc Split PF 0.5 ML SUSY Afluria 2015-2016 (PF) 45 mcg (15 mcg x 3)/0.5 mL IM syringe  TO BE ADMINISTERED BY A PHARMACIST    . pantoprazole (PROTONIX) 40 MG tablet Take by mouth.    . Respiratory Therapy Supplies (NEBULIZER/TUBING/MOUTHPIECE) KIT by Does not apply route.    . chlorpheniramine-HYDROcodone (TUSSIONEX PENNKINETIC ER) 10-8 MG/5ML SUER Take 5 mLs by mouth 2 (two) times daily. (Patient not taking: Reported on 01/31/2020) 140 mL 0  . citalopram (CELEXA) 20 MG tablet Take 20 mg by mouth daily.    . fluticasone (FLOVENT HFA) 220  MCG/ACT inhaler Inhale 2 puffs into the lungs 2 (two) times daily.    Marland Kitchen loratadine (CLARITIN) 10 MG tablet Take 10 mg by mouth daily.    . predniSONE (DELTASONE) 50 MG tablet One tablet by mouth daily (Patient not taking: Reported on 01/31/2020) 4 tablet 0   No current facility-administered medications for this visit.    Family History Family History  Problem Relation Age of Onset  . Diabetes Mother   . Supraventricular tachycardia Mother   . Thyroid disease Mother       Social History Social History   Tobacco Use  . Smoking status: Current Every Day Smoker    Packs/day: 1.00    Types: Cigarettes  . Smokeless tobacco: Never Used  Substance Use Topics  . Alcohol use: No  . Drug use: Never        Review of Systems  Constitutional: Positive for malaise/fatigue. Negative for weight loss.  HENT: Positive for sinus pain.   Respiratory: Positive for wheezing.   Gastrointestinal: Negative for blood in stool, constipation and melena.  Genitourinary: Negative.   Musculoskeletal: Negative.   Skin: Negative for itching and rash.  Neurological: Negative.   Endo/Heme/Allergies: Negative.       Physical Exam Blood pressure 125/73, pulse 94, temperature 98.1 F (36.7 C), temperature source Temporal, height 5' 6"  (1.676 m), weight 177 lb 9.6 oz (80.6 kg), SpO2 98 %. Last Weight  Most recent update:  01/31/2020  3:12 PM   Weight  80.6 kg (177 lb 9.6 oz)            CONSTITUTIONAL: Well developed, and nourished, appropriately responsive and aware without distress.   EYES: Sclera non-icteric.   EARS, NOSE, MOUTH AND THROAT: Mask worn.  Hearing is intact to voice.  NECK: Trachea is midline, and there is no jugular venous distension.  LYMPH NODES:  Lymph nodes in the neck are not enlarged. RESPIRATORY:  Lungs are clear, and breath sounds are equal bilaterally. Normal respiratory effort without pathologic use of accessory muscles. CARDIOVASCULAR: Heart is regular in rate and  rhythm. GI: The abdomen is soft, nontender, and nondistended. There were no palpable masses. I did not appreciate hepatosplenomegaly. There were normal bowel sounds.  MUSCULOSKELETAL:  Symmetrical muscle tone appreciated in all four extremities.    SKIN: Skin turgor is normal. No pathologic skin lesions appreciated.  NEUROLOGIC:  Motor and sensation appear grossly normal.  Cranial nerves are grossly without defect. PSYCH:  Alert and oriented to person, place and time. Affect is appropriate for situation.  Data Reviewed I have personally reviewed what is currently available of the patient's imaging, recent labs and medical records.   Labs:  No flowsheet data found. No flowsheet data found.    Imaging: Radiology review: I have a radiology report from [place] dated January 17, 2020.  Ultrasound of the gallbladder shows normal wall thickness of 1 mm.  No pericholecystic fluid identified.  Multiple mobile gallstones, the largest 5 mm diameter.  Technologist reported mild tenderness over the gallbladder on directed palpation.  Common bile duct is normal in size, 2 mm.  Imaged portions of the liver are unremarkable. Within last 24 hrs: No results found.  Assessment    Chronic calculus cholecystitis/biliary colic. There are no problems to display for this patient.   Plan    Robotic cholecystectomy. The risks, benefits, complications, treatment options, and expected outcomes were discussed with the patient. The possibilities of bleeding, recurrent infection, finding a normal gallbladder, perforation of viscus organs, damage to surrounding structures, bile leak, abscess formation, needing a drain placed, the need for additional procedures, reaction to medication, pulmonary aspiration,  failure to diagnose a condition, the possible need to convert to an open procedure, and creating a complication requiring transfusion or operation were discussed with the patient. The patient  and/or family concurred with the proposed plan, giving informed consent.    Face-to-face time spent with the patient and accompanying care providers(if present) was 30 minutes, with more than 50% of the time spent counseling, educating, and coordinating care of the patient.      Ronny Bacon M.D., FACS 01/31/2020, 5:10 PM

## 2020-02-04 ENCOUNTER — Telehealth: Payer: Self-pay | Admitting: Surgery

## 2020-02-04 NOTE — Telephone Encounter (Signed)
Pt has been advised of pre admission date/time, Covid Testing date and Surgery date.  Surgery Date: 02/13/20 Preadmission Testing Date: 02/06/20 (phone 8a-1p) Covid Testing Date: 02/11/20 - patient advised to [facility] ([address] [facility])  Patient has been made aware to call [phone], between 1-3:00pm the day before surgery, to find out what time to arrive.

## 2020-02-06 ENCOUNTER — Other Ambulatory Visit: Payer: Self-pay

## 2020-02-06 ENCOUNTER — Encounter
Admission: RE | Admit: 2020-02-06 | Discharge: 2020-02-06 | Disposition: A | Discharge status: Home / Self Care | Payer: 59 | Source: Ambulatory Visit | Attending: Surgery | Admitting: Surgery

## 2020-02-06 ENCOUNTER — Ambulatory Visit: Payer: Self-pay | Admitting: Surgery

## 2020-02-06 DIAGNOSIS — K8010 Calculus of gallbladder with chronic cholecystitis without obstruction: Secondary | ICD-10-CM

## 2020-02-06 HISTORY — DX: Anxiety disorder, unspecified: F41.9

## 2020-02-06 HISTORY — DX: Family history of other specified conditions: Z84.89

## 2020-02-06 HISTORY — DX: Gastro-esophageal reflux disease without esophagitis: K21.9

## 2020-02-06 HISTORY — DX: Anemia, unspecified: D64.9

## 2020-02-06 HISTORY — DX: Cardiac murmur, unspecified: R01.1

## 2020-02-06 NOTE — Pre-Procedure Instructions (Signed)
ECG 12 lead (Adult)01/09/2020 [facility] Component Name Value Ref Range  EKG Systolic BP  mmHg  EKG Diastolic BP  mmHg  EKG Ventricular Rate 112 BPM  EKG Atrial Rate 112 BPM  EKG P-R Interval 156 ms  EKG QRS Duration 80 ms  EKG Q-T Interval 330 ms  EKG QTC Calculation 450 ms  EKG Calculated P Axis 47 degrees  EKG Calculated R Axis 45 degrees  EKG Calculated T Axis 53 degrees  QTC Fredericia 406 ms  Result Narrative  SINUS TACHYCARDIA OTHERWISE NORMAL ECG NO PREVIOUS ECGS AVAILABLE Confirmed by Mariane Baumgarten ([id number]) on 01/09/2020 6:35:25 AM  Other Result Information  Interface, Rad Results In - 01/09/2020  6:35 AM EST SINUS TACHYCARDIA OTHERWISE NORMAL ECG NO PREVIOUS ECGS AVAILABLE Confirmed by Mariane Baumgarten ([id number]) on 01/09/2020 6:35:25 AM  Status Results Details   Encounter Summary

## 2020-02-06 NOTE — Patient Instructions (Addendum)
Your procedure is scheduled on: 02-13-20 [facility] Report to Same Day Surgery 2nd floor medical mall [facility] Entrance-take elevator on left to 2nd floor.  Check in with surgery information desk.) To find out your arrival time please call [phone] between 1PM - 3PM on 02-11-30 TUESDAY  Remember: Instructions that are not followed completely may result in serious medical risk, up to and including death, or upon the discretion of your surgeon and anesthesiologist your surgery may need to be rescheduled.    _x___ 1. Do not eat food after midnight the night before your procedure. NO GUM OR CANDY AFTER MIDNIGHT. You may drink clear liquids up to 2 hours before you are scheduled to arrive at the hospital for your procedure.  Do not drink clear liquids within 2 hours of your scheduled arrival to the hospital.  Clear liquids include  --Water or Apple juice without pulp  --Gatorade  --Black Coffee or Clear Tea (No milk, no creamers, do not add anything to the coffee or Tea   ____Ensure clear carbohydrate drink on the way to the hospital for bariatric patients  ____Ensure clear carbohydrate drink 3 hours before surgery.    __x__ 2. No Alcohol for 24 hours before or after surgery.   __x__3. No Smoking or e-cigarettes for 24 prior to surgery.  Do not use any chewable tobacco products for at least 6 hour prior to surgery   ____  4. Bring all medications with you on the day of surgery if instructed.    __x__ 5. Notify your doctor if there is any change in your medical condition     (cold, fever, infections).    x___6. On the morning of surgery brush your teeth with toothpaste and water.  You may rinse your mouth with mouth wash if you wish.  Do not swallow any toothpaste or mouthwash.   Do not wear jewelry, make-up, hairpins, clips or nail polish.  Do not wear lotions, powders, or perfumes.   Do not shave 48 hours prior to surgery. Men may shave face and neck.  Do not bring valuables to  the hospital.    [facility] is not responsible for any belongings or valuables.               Contacts, dentures or bridgework may not be worn into surgery.  Leave your suitcase in the car. After surgery it may be brought to your room.  For patients admitted to the hospital, discharge time is determined by your treatment team.  _  Patients discharged the day of surgery will not be allowed to drive home.  You will need someone to drive you home and stay with you the night of your procedure.    Please read over the following fact sheets that you were given:   [facility] Preparing for Surgery   _x___ TAKE THE FOLLOWING MEDICATION THE MORNING OF SURGERY WITH A SMALL SIP OF WATER. These include:  1. PROTONIX (PANTOPRAZOLE)  2. TAKE AN EXTRA PROTONIX THE NIGHT BEFORE YOUR SURGERY  3. YOU MAY TAKE XANAX (ALPRAZOLAM) THE MORNING OF SURGERY IF NEEDED  4.  5.  6.  ____Fleets enema or Magnesium Citrate as directed.   _x___ Use CHG Soap or sage wipes as directed on instruction sheet   _X___ BRING ALBUTEROL INHALER TO HOSPITAL DAY OF SURGERY  ____ Stop Metformin and Janumet 2 days prior to surgery.    ____ Take 1/2 of usual insulin dose the night before surgery and none on  the morning surgery.   ____ Follow recommendations from Cardiologist, Pulmonologist or PCP regarding stopping Aspirin, Coumadin, Plavix ,Eliquis, Effient, or Pradaxa, and Pletal.  X____Stop Anti-inflammatories such as Advil, Aleve, Ibuprofen, Motrin, Naproxen, Naprosyn, Goodies powders or aspirin products NOW-OK to take Tylenol    _x___ Stop supplements until after surgery-STOP FISH OIL NOW-YOU MAY RESUME AFTER YOUR SURGERY   ____ Bring C-Pap to the hospital.

## 2020-02-07 NOTE — Pre-Procedure Instructions (Signed)
Called Misty Stanley at Dr Kathrin [facility] office and informed her that pt is needing cardiac clearance. Informed her that I faxed the clearance to their office along with her cardiologist office with fax confirmation received from both offices.

## 2020-02-07 NOTE — Pre-Procedure Instructions (Addendum)
Called Dr Henrene Hawking regarding pt who was covid + feb 2021 who has been experiencing palpitations/tachycardia since covid. Just saw cardiologist dr Nedra Hai at [facility] who did ekg and wants to do TEE but this cant be done until 4-12 and pts surgery is 3-31. Dr Henrene Hawking said cardiac clearance is needed. Dr Henrene Hawking said he prefers echo to be done prior to surgery but that if cardiology clears pt without having this done then that will be ok as well

## 2020-02-08 ENCOUNTER — Telehealth: Payer: Self-pay | Admitting: *Deleted

## 2020-02-08 NOTE — Pre-Procedure Instructions (Signed)
[facility] cardiology and got correct fax # to send her clearance to. Was instructed to send to Dr Nedra Hai as Urgent.  Fax confirmation received

## 2020-02-08 NOTE — Telephone Encounter (Signed)
Dr Nedra Hai from [facility] called and stated that he saw the patient and she had a echo and everything is good to proceed with surgery. I did advise him that we will need something in writing and that I will give this information to our nurse and if she has any questions or need to get any more information she can call [phone]

## 2020-02-11 ENCOUNTER — Other Ambulatory Visit: Payer: 59

## 2020-02-11 ENCOUNTER — Other Ambulatory Visit: Payer: Self-pay

## 2020-02-11 ENCOUNTER — Other Ambulatory Visit
Admission: RE | Admit: 2020-02-11 | Discharge: 2020-02-11 | Disposition: A | Discharge status: Home / Self Care | Payer: 59 | Source: Ambulatory Visit | Attending: Surgery | Admitting: Surgery

## 2020-02-11 DIAGNOSIS — Z01812 Encounter for preprocedural laboratory examination: Secondary | ICD-10-CM | POA: Diagnosis present

## 2020-02-11 DIAGNOSIS — Z20822 Contact with and (suspected) exposure to covid-19: Secondary | ICD-10-CM | POA: Diagnosis not present

## 2020-02-11 LAB — SARS CORONAVIRUS 2 (TAT 6-24 HRS): SARS Coronavirus 2: NEGATIVE

## 2020-02-11 NOTE — Telephone Encounter (Signed)
Request for clearance has been re faxed to Dr Marigene Ehlers office.

## 2020-02-12 ENCOUNTER — Telehealth: Payer: Self-pay | Admitting: Emergency Medicine

## 2020-02-12 MED ORDER — INDOCYANINE GREEN 25 MG IV SOLR
1.2500 mg | Freq: Once | INTRAVENOUS | Status: AC
  Administered 2020-02-13: 1.25 mg via INTRAVENOUS
  Filled 2020-02-12: qty 10

## 2020-02-12 NOTE — Pre-Procedure Instructions (Signed)
CARDIOLOGY CLEARANCE RECEIVED.

## 2020-02-12 NOTE — Telephone Encounter (Signed)
Called Dr Marigene Ehlers office in reference to cardiac clearance. Spoke to [place] and did not know anything so she transferred me to the nurses line where no one answer. Left detailed message about this and that previous attempts have been made trying to contact them. Left number to call back to the clinic.

## 2020-02-12 NOTE — Progress Notes (Signed)
Per Pre Admit testing they have not received any clearance form from Dr Marigene Ehlers office. I placed a call to Dr Marigene Ehlers office yesterday letting them know about the request for clearance and did fax over the request form from Pre Admit testing. I called their office again today to check on this and left a message with the clinic nurse about this and again faxed over the form to their office attention to Dr Nedra Hai.

## 2020-02-12 NOTE — Telephone Encounter (Signed)
Sheryl from [facility] cardiology called back and she transferred me to a nurse name Darl Pikes. Darl Pikes states that they faxed the clearance form to [place] and called and someone verbally stated we received it. She gave me the fax number they sent it to and it was to [facility] not to our office. I told Darl Pikes I will just obtain it from [facility]. Called over to [facility] with no answer left vm to call back to the clinic.

## 2020-02-13 ENCOUNTER — Ambulatory Visit: Payer: 59

## 2020-02-13 ENCOUNTER — Other Ambulatory Visit: Payer: Self-pay

## 2020-02-13 ENCOUNTER — Encounter
Admission: RE | Disposition: A | Discharge status: Home / Self Care | Payer: Self-pay | Source: Home / Self Care | Attending: Surgery

## 2020-02-13 ENCOUNTER — Ambulatory Visit
Admission: RE | Admit: 2020-02-13 | Discharge: 2020-02-13 | Disposition: A | Discharge status: Home / Self Care | Payer: 59 | Attending: Surgery | Admitting: Surgery

## 2020-02-13 ENCOUNTER — Encounter: Payer: Self-pay | Admitting: Surgery

## 2020-02-13 DIAGNOSIS — F17210 Nicotine dependence, cigarettes, uncomplicated: Secondary | ICD-10-CM | POA: Insufficient documentation

## 2020-02-13 DIAGNOSIS — K8010 Calculus of gallbladder with chronic cholecystitis without obstruction: Secondary | ICD-10-CM | POA: Diagnosis present

## 2020-02-13 DIAGNOSIS — Z79899 Other long term (current) drug therapy: Secondary | ICD-10-CM | POA: Diagnosis not present

## 2020-02-13 LAB — POCT PREGNANCY, URINE: Preg Test, Ur: NEGATIVE

## 2020-02-13 SURGERY — CHOLECYSTECTOMY, ROBOT-ASSISTED, LAPAROSCOPIC
Anesthesia: General | Site: Abdomen

## 2020-02-13 MED ORDER — PROPOFOL 10 MG/ML IV BOLUS
INTRAVENOUS | Status: AC
  Filled 2020-02-13: qty 20

## 2020-02-13 MED ORDER — IBUPROFEN 800 MG PO TABS: 800.0000 mg | ORAL_TABLET | Freq: Three times a day (TID) | ORAL | 0 refills | Status: DC | PRN

## 2020-02-13 MED ORDER — CEFAZOLIN SODIUM-DEXTROSE 2-4 GM/100ML-% IV SOLN
2.0000 g | INTRAVENOUS | Status: AC
  Administered 2020-02-13: 2 g via INTRAVENOUS

## 2020-02-13 MED ORDER — DEXMEDETOMIDINE HCL IN NACL 80 MCG/20ML IV SOLN
INTRAVENOUS | Status: AC
  Filled 2020-02-13: qty 20

## 2020-02-13 MED ORDER — MIDAZOLAM HCL 2 MG/2ML IJ SOLN
INTRAMUSCULAR | Status: DC | PRN
  Administered 2020-02-13: 2 mg via INTRAVENOUS

## 2020-02-13 MED ORDER — DEXMEDETOMIDINE HCL 200 MCG/2ML IV SOLN
INTRAVENOUS | Status: DC | PRN
  Administered 2020-02-13: 8 ug via INTRAVENOUS
  Administered 2020-02-13 (×2): 4 ug via INTRAVENOUS

## 2020-02-13 MED ORDER — PHENYLEPHRINE HCL (PRESSORS) 10 MG/ML IV SOLN
INTRAVENOUS | Status: AC
  Filled 2020-02-13: qty 1

## 2020-02-13 MED ORDER — PROMETHAZINE HCL 25 MG/ML IJ SOLN
6.2500 mg | Freq: Once | INTRAMUSCULAR | Status: AC
  Administered 2020-02-13: 6.25 mg via INTRAVENOUS

## 2020-02-13 MED ORDER — HYDROCODONE-ACETAMINOPHEN 5-325 MG PO TABS: 1.0000 | ORAL_TABLET | Freq: Four times a day (QID) | ORAL | 0 refills | Status: DC | PRN

## 2020-02-13 MED ORDER — BUPIVACAINE-EPINEPHRINE (PF) 0.25% -1:200000 IJ SOLN
INTRAMUSCULAR | Status: DC | PRN
  Administered 2020-02-13: 30 mL

## 2020-02-13 MED ORDER — GLYCOPYRROLATE 0.2 MG/ML IJ SOLN
INTRAMUSCULAR | Status: AC
  Filled 2020-02-13: qty 1

## 2020-02-13 MED ORDER — FENTANYL CITRATE (PF) 100 MCG/2ML IJ SOLN
INTRAMUSCULAR | Status: DC | PRN
  Administered 2020-02-13: 100 ug via INTRAVENOUS

## 2020-02-13 MED ORDER — CHLORHEXIDINE GLUCONATE CLOTH 2 % EX PADS: 6.0000 | MEDICATED_PAD | Freq: Once | CUTANEOUS | Status: DC

## 2020-02-13 MED ORDER — IBUPROFEN 800 MG PO TABS
800.0000 mg | ORAL_TABLET | Freq: Three times a day (TID) | ORAL | Status: DC | PRN
  Administered 2020-02-13: 800 mg via ORAL
  Filled 2020-02-13: qty 1

## 2020-02-13 MED ORDER — CEFAZOLIN SODIUM-DEXTROSE 2-4 GM/100ML-% IV SOLN
INTRAVENOUS | Status: AC
  Filled 2020-02-13: qty 100

## 2020-02-13 MED ORDER — BUPIVACAINE LIPOSOME 1.3 % IJ SUSP: 20.0000 mL | Freq: Once | INTRAMUSCULAR | Status: DC

## 2020-02-13 MED ORDER — MIDAZOLAM HCL 2 MG/2ML IJ SOLN
INTRAMUSCULAR | Status: AC
  Filled 2020-02-13: qty 2

## 2020-02-13 MED ORDER — CELECOXIB 200 MG PO CAPS
200.0000 mg | ORAL_CAPSULE | ORAL | Status: AC
  Administered 2020-02-13: 200 mg via ORAL

## 2020-02-13 MED ORDER — ROCURONIUM BROMIDE 100 MG/10ML IV SOLN
INTRAVENOUS | Status: DC | PRN
  Administered 2020-02-13: 10 mg via INTRAVENOUS
  Administered 2020-02-13: 40 mg via INTRAVENOUS

## 2020-02-13 MED ORDER — FENTANYL CITRATE (PF) 100 MCG/2ML IJ SOLN
25.0000 ug | INTRAMUSCULAR | Status: DC | PRN
  Administered 2020-02-13 (×4): 25 ug via INTRAVENOUS

## 2020-02-13 MED ORDER — EPINEPHRINE PF 1 MG/ML IJ SOLN
INTRAMUSCULAR | Status: AC
  Filled 2020-02-13: qty 1

## 2020-02-13 MED ORDER — ACETAMINOPHEN 500 MG PO TABS
ORAL_TABLET | ORAL | Status: AC
  Filled 2020-02-13: qty 2

## 2020-02-13 MED ORDER — ONDANSETRON HCL 4 MG/2ML IJ SOLN
4.0000 mg | Freq: Once | INTRAMUSCULAR | Status: AC | PRN
  Administered 2020-02-13: 09:00:00 4 mg via INTRAVENOUS

## 2020-02-13 MED ORDER — FENTANYL CITRATE (PF) 100 MCG/2ML IJ SOLN
INTRAMUSCULAR | Status: AC
  Filled 2020-02-13: qty 2

## 2020-02-13 MED ORDER — PROPOFOL 10 MG/ML IV BOLUS
INTRAVENOUS | Status: DC | PRN
  Administered 2020-02-13: 50 mg via INTRAVENOUS
  Administered 2020-02-13: 150 mg via INTRAVENOUS

## 2020-02-13 MED ORDER — SODIUM CHLORIDE (PF) 0.9 % IJ SOLN
INTRAMUSCULAR | Status: AC
  Filled 2020-02-13: qty 10

## 2020-02-13 MED ORDER — LIDOCAINE HCL URETHRAL/MUCOSAL 2 % EX GEL
CUTANEOUS | Status: AC
  Filled 2020-02-13: qty 5

## 2020-02-13 MED ORDER — GABAPENTIN 300 MG PO CAPS
ORAL_CAPSULE | ORAL | Status: AC
  Filled 2020-02-13: qty 1

## 2020-02-13 MED ORDER — LIDOCAINE HCL (CARDIAC) PF 100 MG/5ML IV SOSY
PREFILLED_SYRINGE | INTRAVENOUS | Status: DC | PRN
  Administered 2020-02-13: 100 mg via INTRAVENOUS

## 2020-02-13 MED ORDER — ROCURONIUM BROMIDE 10 MG/ML (PF) SYRINGE
PREFILLED_SYRINGE | INTRAVENOUS | Status: AC
  Filled 2020-02-13: qty 10

## 2020-02-13 MED ORDER — FENTANYL CITRATE (PF) 100 MCG/2ML IJ SOLN
INTRAMUSCULAR | Status: AC
  Administered 2020-02-13: 25 ug via INTRAVENOUS
  Filled 2020-02-13: qty 2

## 2020-02-13 MED ORDER — PHENYLEPHRINE HCL (PRESSORS) 10 MG/ML IV SOLN
INTRAVENOUS | Status: DC | PRN
  Administered 2020-02-13 (×2): 50 ug via INTRAVENOUS

## 2020-02-13 MED ORDER — LIDOCAINE HCL (PF) 2 % IJ SOLN
INTRAMUSCULAR | Status: AC
  Filled 2020-02-13: qty 5

## 2020-02-13 MED ORDER — PROMETHAZINE HCL 25 MG/ML IJ SOLN
INTRAMUSCULAR | Status: AC
  Filled 2020-02-13: qty 1

## 2020-02-13 MED ORDER — BUPIVACAINE LIPOSOME 1.3 % IJ SUSP
INTRAMUSCULAR | Status: DC | PRN
  Administered 2020-02-13: 20 mL

## 2020-02-13 MED ORDER — ACETAMINOPHEN 500 MG PO TABS
1000.0000 mg | ORAL_TABLET | ORAL | Status: AC
  Administered 2020-02-13: 1000 mg via ORAL

## 2020-02-13 MED ORDER — GABAPENTIN 300 MG PO CAPS
300.0000 mg | ORAL_CAPSULE | ORAL | Status: AC
  Administered 2020-02-13: 300 mg via ORAL

## 2020-02-13 MED ORDER — DEXAMETHASONE SODIUM PHOSPHATE 10 MG/ML IJ SOLN
INTRAMUSCULAR | Status: DC | PRN
  Administered 2020-02-13: 10 mg via INTRAVENOUS

## 2020-02-13 MED ORDER — SEVOFLURANE IN SOLN
RESPIRATORY_TRACT | Status: AC
  Filled 2020-02-13: qty 250

## 2020-02-13 MED ORDER — ONDANSETRON HCL 4 MG/2ML IJ SOLN
INTRAMUSCULAR | Status: AC
  Filled 2020-02-13: qty 2

## 2020-02-13 MED ORDER — SUGAMMADEX SODIUM 200 MG/2ML IV SOLN
INTRAVENOUS | Status: DC | PRN
  Administered 2020-02-13: 200 mg via INTRAVENOUS

## 2020-02-13 MED ORDER — BUPIVACAINE HCL (PF) 0.25 % IJ SOLN
INTRAMUSCULAR | Status: AC
  Filled 2020-02-13: qty 30

## 2020-02-13 MED ORDER — IBUPROFEN 800 MG PO TABS
ORAL_TABLET | ORAL | Status: AC
  Filled 2020-02-13: qty 1

## 2020-02-13 MED ORDER — CELECOXIB 200 MG PO CAPS
ORAL_CAPSULE | ORAL | Status: AC
  Filled 2020-02-13: qty 1

## 2020-02-13 MED ORDER — BUPIVACAINE LIPOSOME 1.3 % IJ SUSP
INTRAMUSCULAR | Status: AC
  Filled 2020-02-13: qty 20

## 2020-02-13 MED ORDER — LACTATED RINGERS IV SOLN: INTRAVENOUS | Status: DC

## 2020-02-13 SURGICAL SUPPLY — 37 items
CANISTER SUCT 1200ML W/VALVE (MISCELLANEOUS) ×4 IMPLANT
CHLORAPREP W/TINT 26 (MISCELLANEOUS) ×4 IMPLANT
CLIP VESOLOCK MED LG 6/CT (CLIP) ×8 IMPLANT
COVER TIP SHEARS 8 DVNC (MISCELLANEOUS) ×2 IMPLANT
COVER TIP SHEARS 8MM DA VINCI (MISCELLANEOUS) ×2
COVER WAND RF STERILE (DRAPES) ×4 IMPLANT
DECANTER SPIKE VIAL GLASS SM (MISCELLANEOUS) ×4 IMPLANT
DEFOGGER SCOPE WARMER CLEARIFY (MISCELLANEOUS) ×4 IMPLANT
DERMABOND ADVANCED (GAUZE/BANDAGES/DRESSINGS) ×2
DERMABOND ADVANCED .7 DNX12 (GAUZE/BANDAGES/DRESSINGS) ×2 IMPLANT
DRAPE ARM DVNC X/XI (DISPOSABLE) ×8 IMPLANT
DRAPE COLUMN DVNC XI (DISPOSABLE) ×2 IMPLANT
DRAPE DA VINCI XI ARM (DISPOSABLE) ×8
DRAPE DA VINCI XI COLUMN (DISPOSABLE) ×2
GLOVE ORTHO TXT STRL SZ7.5 (GLOVE) ×8 IMPLANT
GOWN STRL REUS W/ TWL LRG LVL3 (GOWN DISPOSABLE) ×8 IMPLANT
GOWN STRL REUS W/TWL LRG LVL3 (GOWN DISPOSABLE) ×8
GRASPER SUT TROCAR 14GX15 (MISCELLANEOUS) IMPLANT
IRRIGATION STRYKERFLOW (MISCELLANEOUS) IMPLANT
IRRIGATOR STRYKERFLOW (MISCELLANEOUS)
IV NS IRRIG 3000ML ARTHROMATIC (IV SOLUTION) IMPLANT
KIT PINK PAD W/HEAD ARE REST (MISCELLANEOUS) ×4
KIT PINK PAD W/HEAD ARM REST (MISCELLANEOUS) ×2 IMPLANT
KIT TURNOVER KIT A (KITS) ×4 IMPLANT
LABEL OR SOLS (LABEL) ×4 IMPLANT
NEEDLE HYPO 22GX1.5 SAFETY (NEEDLE) ×4 IMPLANT
NEEDLE INSUFFLATION 14GA 120MM (NEEDLE) IMPLANT
NS IRRIG 500ML POUR BTL (IV SOLUTION) ×4 IMPLANT
PACK LAP CHOLECYSTECTOMY (MISCELLANEOUS) ×4 IMPLANT
POUCH SPECIMEN RETRIEVAL 10MM (ENDOMECHANICALS) ×4 IMPLANT
SEAL CANN UNIV 5-8 DVNC XI (MISCELLANEOUS) ×8 IMPLANT
SEAL XI 5MM-8MM UNIVERSAL (MISCELLANEOUS) ×8
SET TUBE SMOKE EVAC HIGH FLOW (TUBING) ×4 IMPLANT
SOLUTION ELECTROLUBE (MISCELLANEOUS) ×4 IMPLANT
SUT MNCRL AB 4-0 PS2 18 (SUTURE) ×4 IMPLANT
SUT VICRYL 0 AB UR-6 (SUTURE) ×4 IMPLANT
TROCAR Z-THREAD FIOS 11X100 BL (TROCAR) ×4 IMPLANT

## 2020-02-13 NOTE — OR Nursing (Signed)
Nausea discussed with Dr. Pernell Dupre, phenergan given as ordered.

## 2020-02-13 NOTE — Anesthesia Procedure Notes (Signed)
Procedure Name: Intubation Date/Time: 02/13/2020 7:36 AM Performed by: Rolla Plate, CRNA Pre-anesthesia Checklist: Patient identified, Patient being monitored, Timeout performed, Emergency Drugs available and Suction available Patient Re-evaluated:Patient Re-evaluated prior to induction Oxygen Delivery Method: Circle system utilized Preoxygenation: Pre-oxygenation with 100% oxygen Induction Type: IV induction Ventilation: Mask ventilation without difficulty Laryngoscope Size: Mac and 4 Grade View: Grade I Tube type: Oral Tube size: 7.0 mm Number of attempts: 1 Airway Equipment and Method: Stylet Placement Confirmation: ETT inserted through vocal cords under direct vision,  positive ETCO2 and breath sounds checked- equal and bilateral Secured at: 21 cm Tube secured with: Tape Dental Injury: Teeth and Oropharynx as per pre-operative assessment

## 2020-02-13 NOTE — Transfer of Care (Signed)
Immediate Anesthesia Transfer of Care Note  Patient: Tanya Watts  Procedure(s) Performed: XI ROBOTIC ASSISTED LAPAROSCOPIC CHOLECYSTECTOMY (N/A Abdomen)  Patient Location: PACU  Anesthesia Type:General  Level of Consciousness: sedated  Airway & Oxygen Therapy: Patient Spontanous Breathing and Patient connected to face mask oxygen  Post-op Assessment: Report given to RN and Post -op Vital signs reviewed and stable  Post vital signs: Reviewed  Last Vitals:  Vitals Value Taken Time  BP 142/93 02/13/20 0847  Temp    Pulse 83 02/13/20 0848  Resp 21 02/13/20 0848  SpO2 99 % 02/13/20 0848  Vitals shown include unvalidated device data.  Last Pain:  Vitals:   02/13/20 0619  TempSrc: Tympanic  PainSc: 0-No pain         Complications: No apparent anesthesia complications

## 2020-02-13 NOTE — Op Note (Signed)
Robotic cholecystectomy  Pre-operative Diagnosis: Chronic calculus cholecystitis  Post-operative Diagnosis:  Same.  Procedure: Robotic assisted laparoscopic cholecystectomy.  Surgeon: Campbell Lerner, M.D., FACS  Anesthesia: General. with endotracheal tube   Estimated Blood Loss: 5 mL         Drains: None         Specimens: Gallbladder           Complications: none   Procedure Details  The patient was seen again in the Holding Room.  1.25 mg dose of ICG was administered intravenously.  The benefits, complications, treatment options, risks and expected outcomes were discussed with the patient. The likelihood of improving the patient's symptoms with return to their baseline status is good.  The patient and/or family concurred with the proposed plan, giving informed consent, again alternatives reviewed.  The patient was taken to Operating Room, identified, and the procedure verified as robotic assisted laparoscopic cholecystectomy.   Prior to the induction of general anesthesia, antibiotic prophylaxis was administered. VTE prophylaxis was in place. General endotracheal anesthesia was then administered and tolerated well. The patient was positioned in the supine position.  After the induction, the abdomen was prepped with Chloraprep and draped in the sterile fashion.    A Time Out was held and the above information confirmed.  Right infra-umbilical local infiltration with an Exparel mixture with quarter percent Marcaine with epinephrine is utilized.  Made a 12 mm incision on the right periumbilical site, I advanced an optical 66mm port under direct visualization into the peritoneal cavity.  Once the peritoneum was penetrated, insufflation was transferred.  The trocar was then advanced into the abdominal cavity under direct visualization. Pneumoperitoneum was then continued with CO2 at 14 mmHg or less and tolerated well without any adverse changes in the patient's vital signs.  Two 8.5-mm  ports were placed in the left lower quadrant and laterally, and one to the right lower quadrant, all under direct vision. All skin incisions  were infiltrated with a local anesthetic agent before making the incision and placing the trocars.   The patient was positioned  in reverse Trendelenburg, tilted the patient's left side down.  Da Vinci XI robot was then positioned on to the patient's left side, and docked.  The gallbladder was identified, the fundus grasped via the arm 4 Prograsp and retracted cephalad. Adhesions were lysed with scissors and cautery.  The infundibulum, was not pronounced and very well tapered, however, was identified grasped and retracted laterally, exposing the peritoneum overlying the triangle of Calot. This was then opened and dissected using cautery & scissors. An extended critical view of the cystic duct and cystic artery was obtained, aided by the ICG via FireFly which enabled ready visualization of the ductal anatomy.    The cystic duct was clearly identified and dissected to isolation.   Artery well isolated and clipped, and the cystic duct was triple clipped and divided.  The gallbladder aspect of the artery was cauterized with bipolar prior to its division.  The gallbladder was taken from the gallbladder fossa in a retrograde fashion with the electrocautery. The gallbladder was removed and placed in an Endocatch bag.   The robot was undocked and moved away from the operative field. The gallbladder and Endocatch sac were then removed through the infraumbilical port site.   Inspection of the right upper quadrant was performed. No bleeding, bile duct injury or leak, or bowel injury was noted. The infra-umbilical port site fascia was closed with interrumpted 0 Vicryl sutures using PMI/cone  under direct visualization. Pneumoperitoneum was released and ports removed.  4-0 subcuticular Monocryl was used to close the skin. Dermabond was  applied.  The patient was then extubated  and brought to the recovery room in stable condition. Sponge, lap, and needle counts were correct at closure and at the conclusion of the case.               Ronny Bacon, M.D., [facility] 02/13/2020 8:48 AM

## 2020-02-13 NOTE — Anesthesia Preprocedure Evaluation (Signed)
Anesthesia Evaluation  Patient identified by MRN, date of birth, ID band Patient awake    Reviewed: Allergy & Precautions, H&P , NPO status , Patient's Chart, lab work & pertinent test results, reviewed documented beta blocker date and time   Airway Mallampati: II  TM Distance: >3 FB Neck ROM: full    Dental  (+) Teeth Intact   Pulmonary neg pulmonary ROS, neg shortness of breath, asthma , Current Smoker and Patient abstained from smoking.,    Pulmonary exam normal        Cardiovascular Exercise Tolerance: Good hypertension, On Medications negative cardio ROS Normal cardiovascular exam+ Valvular Problems/Murmurs  Rhythm:regular Rate:Normal     Neuro/Psych Anxiety negative neurological ROS  negative psych ROS   GI/Hepatic Neg liver ROS, GERD  Medicated,  Endo/Other  negative endocrine ROS  Renal/GU negative Renal ROS  negative genitourinary   Musculoskeletal   Abdominal   Peds  Hematology  (+) Blood dyscrasia, anemia ,   Anesthesia Other Findings Past Medical History: No date: Anemia     Comment:  h/o during pregnancy No date: Anxiety No date: Asthma     Comment:  well controlled No date: Family history of adverse reaction to anesthesia     Comment:  mom-nasueated only/ sister-harder to wake up No date: GERD (gastroesophageal reflux disease) No date: Heart murmur     Comment:  asymptomatic No date: Preeclampsia History reviewed. No pertinent surgical history. BMI    Body Mass Index: 28.89 kg/m     Reproductive/Obstetrics negative OB ROS                             Anesthesia Physical Anesthesia Plan  ASA: II  Anesthesia Plan: General ETT   Post-op Pain Management:    Induction:   PONV Risk Score and Plan: 3  Airway Management Planned:   Additional Equipment:   Intra-op Plan:   Post-operative Plan:   Informed Consent: I have reviewed the patients History and  Physical, chart, labs and discussed the procedure including the risks, benefits and alternatives for the proposed anesthesia with the patient or authorized representative who has indicated his/her understanding and acceptance.     Dental Advisory Given  Plan Discussed with: CRNA  Anesthesia Plan Comments:         Anesthesia Quick Evaluation

## 2020-02-13 NOTE — Discharge Instructions (Signed)

## 2020-02-13 NOTE — Interval H&P Note (Signed)
History and Physical Interval Note:  02/13/2020 7:33 AM  Tanya Watts  has presented today for surgery, with the diagnosis of CCC (chronic calculous cholecystitis).  The various methods of treatment have been discussed with the patient and family. After consideration of risks, benefits and other options for treatment, the patient has consented to  Procedure(s): XI ROBOTIC ASSISTED LAPAROSCOPIC CHOLECYSTECTOMY (N/A) as a surgical intervention.  The patient's history has been reviewed, patient examined, no change in status, stable for surgery.  I have reviewed the patient's chart and labs.  Questions were answered to the patient's satisfaction.     Campbell Lerner

## 2020-02-14 LAB — SURGICAL PATHOLOGY

## 2020-02-14 NOTE — Anesthesia Postprocedure Evaluation (Signed)
Anesthesia Post Note  Patient: JALISE ZAWISTOWSKI  Procedure(s) Performed: XI ROBOTIC ASSISTED LAPAROSCOPIC CHOLECYSTECTOMY (N/A Abdomen) INDOCYANINE GREEN FLUORESCENCE IMAGING (ICG)  Patient location during evaluation: PACU Anesthesia Type: General Level of consciousness: awake and alert Pain management: pain level controlled Vital Signs Assessment: post-procedure vital signs reviewed and stable Respiratory status: spontaneous breathing, nonlabored ventilation, respiratory function stable and patient connected to nasal cannula oxygen Cardiovascular status: blood pressure returned to baseline and stable Postop Assessment: no apparent nausea or vomiting Anesthetic complications: no     Last Vitals:  Vitals:   02/13/20 1019 02/13/20 1101  BP: 121/81 125/74  Pulse: (!) 57 (!) 56  Resp: 18 16  Temp:    SpO2: 99% 100%    Last Pain:  Vitals:   02/14/20 0807  TempSrc:   PainSc: 1                  Yevette Edwards

## 2020-02-26 ENCOUNTER — Encounter: Payer: Self-pay | Admitting: Physician Assistant

## 2020-02-26 ENCOUNTER — Other Ambulatory Visit: Payer: Self-pay

## 2020-02-26 ENCOUNTER — Ambulatory Visit (INDEPENDENT_AMBULATORY_CARE_PROVIDER_SITE_OTHER): Payer: Self-pay | Admitting: Physician Assistant

## 2020-02-26 VITALS — BP 148/93 | HR 112 | Temp 98.1°F | Resp 12 | Wt 171.2 lb

## 2020-02-26 DIAGNOSIS — Z09 Encounter for follow-up examination after completed treatment for conditions other than malignant neoplasm: Secondary | ICD-10-CM

## 2020-02-26 DIAGNOSIS — K8010 Calculus of gallbladder with chronic cholecystitis without obstruction: Secondary | ICD-10-CM

## 2020-02-26 NOTE — Progress Notes (Signed)
[facility] POST-OP OFFICE VISIT  02/26/2020  HPI: Tanya Watts is a 39 y.o. 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   Overall doing well No issues with abdominal pain Intermittent diarrhea which she attributes to anxiety No fever, chills, nausea, or emesis Tolerating PO Anxious about returning to working out  Vital signs: BP (!) 148/93   Pulse (!) 112   Temp 98.1 F (36.7 C)   Resp 12   Wt 171 lb 3.2 oz (77.7 kg)   SpO2 98%   BMI 27.63 kg/m    Physical Exam: Constitutional: Well appearing female, NAD Abdomen: Soft, Non-tender, non-distended, no rebound/guarding Skin: Laparoscopic incisions are CDI with dermabond which is peeling off, no erythema or drainage  Assessment/Plan: This is a 39 y.o. 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 She understands that light activity is ok  - Reviewed pathology: CCC, negative for malignancy  - rtc prn  -- Lynden Oxford, PA-C [facility] 02/26/2020, 10:41 AM [phone] M-F: 7am - 4pm

## 2020-02-26 NOTE — Patient Instructions (Addendum)
Follow up as needed. Call the office if you have any questions or concerns.   GENERAL POST-OPERATIVE PATIENT INSTRUCTIONS   WOUND CARE INSTRUCTIONS:  Keep a dry clean dressing on the wound if there is drainage. The initial bandage may be removed after 24 hours.  Once the wound has quit draining you may leave it open to air.  If clothing rubs against the wound or causes irritation and the wound is not draining you may cover it with a dry dressing during the daytime.  Try to keep the wound dry and avoid ointments on the wound unless directed to do so.  If the wound becomes bright red and painful or starts to drain infected material that is not clear, please contact your physician immediately.  If the wound is mildly pink and has a thick firm ridge underneath it, this is normal, and is referred to as a healing ridge.  This will resolve over the next 4-6 weeks.  BATHING: You may shower if you have been informed of this by your surgeon. However, Please do not submerge in a tub, hot tub, or pool until incisions are completely sealed or have been told by your surgeon that you may do so.  DIET:  You may eat any foods that you can tolerate.  It is a good idea to eat a high fiber diet and take in plenty of fluids to prevent constipation.  If you do become constipated you may want to take a mild laxative or take ducolax tablets on a daily basis until your bowel habits are regular.  Constipation can be very uncomfortable, along with straining, after recent surgery.  ACTIVITY:  You are encouraged to cough and deep breath or use your incentive spirometer if you were given one, every 15-30 minutes when awake.  This will help prevent respiratory complications and low grade fevers post-operatively if you had a general anesthetic.  You may want to hug a pillow when coughing and sneezing to add additional support to the surgical area, if you had abdominal or chest surgery, which will decrease pain during these times.  You  are encouraged to walk and engage in light activity for the next two weeks.  You should not lift more than 15-20 pounds, until 03/12/20 as it could put you at increased risk for complications.  Twenty pounds is roughly equivalent to a plastic bag of groceries. At that time- Listen to your body when lifting, if you have pain when lifting, stop and then try again in a few days. Soreness after doing exercises or activities of daily living is normal as you get back in to your normal routine.  MEDICATIONS:  Try to take narcotic medications and anti-inflammatory medications, such as tylenol, ibuprofen, naprosyn, etc., with food.  This will minimize stomach upset from the medication.  Should you develop nausea and vomiting from the pain medication, or develop a rash, please discontinue the medication and contact your physician.  You should not drive, make important decisions, or operate machinery when taking narcotic pain medication.  SUNBLOCK Use sun block to incision area over the next year if this area will be exposed to sun. This helps decrease scarring and will allow you avoid a permanent darkened area over your incision.  QUESTIONS:  Please feel free to call our office if you have any questions, and we will be glad to assist you.

## 2020-02-28 ENCOUNTER — Encounter: Payer: Medicaid Other | Admitting: Surgery

## 2020-03-06 ENCOUNTER — Ambulatory Visit (INDEPENDENT_AMBULATORY_CARE_PROVIDER_SITE_OTHER): Payer: Self-pay | Admitting: Surgery

## 2020-03-06 ENCOUNTER — Other Ambulatory Visit: Payer: Self-pay

## 2020-03-06 ENCOUNTER — Encounter: Payer: Self-pay | Admitting: Surgery

## 2020-03-06 VITALS — BP 136/87 | HR 86 | Temp 97.1°F | Ht 66.0 in | Wt 173.0 lb

## 2020-03-06 DIAGNOSIS — Z9049 Acquired absence of other specified parts of digestive tract: Secondary | ICD-10-CM

## 2020-03-06 NOTE — Progress Notes (Signed)
[facility] POST-OP OFFICE VISIT  03/06/2020  HPI: Tanya Watts is a 39 y.o. female 22 days s/p robotic cholecystectomy.  She is resuming regular activity quite quickly and was concerned that she may have done too much.  She does note increased soreness of her 12 mm port site, and she is also anticipating travel and amusement park rides in the near future.  Doing well from a dietary perspective and bowel activity.  She wanted confirmation on her planned activities.  Vital signs: BP 136/87   Pulse 86   Temp (!) 97.1 F (36.2 C)   Ht 5\' 6"  (1.676 m)   Wt 173 lb (78.5 kg)   LMP 02/11/2020 (Exact Date)   SpO2 98%   BMI 27.92 kg/m    Physical Exam: Constitutional: She appears well, fit, energetic and happy. Abdomen: Soft and nontender. Skin: All skin incisions appear to be healing nicely, there is no evidence of any underlying induration, or mass on any sites.  Assessment/Plan: This is a 39 y.o. female 22 days s/p robotic cholecystectomy  There are no problems to display for this patient.   -Advised she may gradually advance activity as tolerated, to be attentive of how her body feels.  She may proceed with her travel plans without limitation.  Follow-up with 24 as needed   [place] M.D., [facility] 03/06/2020, 2:45 PM

## 2020-03-06 NOTE — Patient Instructions (Addendum)
Follow-up with our office as needed.  Please call and ask to speak with a nurse if you develop questions or concerns.   GENERAL POST-OPERATIVE PATIENT INSTRUCTIONS   WOUND CARE INSTRUCTIONS:  Keep a dry clean dressing on the wound if there is drainage. The initial bandage may be removed after 24 hours.  Once the wound has quit draining you may leave it open to air.  If clothing rubs against the wound or causes irritation and the wound is not draining you may cover it with a dry dressing during the daytime.  Try to keep the wound dry and avoid ointments on the wound unless directed to do so.  If the wound becomes bright red and painful or starts to drain infected material that is not clear, please contact your physician immediately.  If the wound is mildly pink and has a thick firm ridge underneath it, this is normal, and is referred to as a healing ridge.  This will resolve over the next 4-6 weeks.  BATHING: You may shower if you have been informed of this by your surgeon. However, Please do not submerge in a tub, hot tub, or pool until incisions are completely sealed or have been told by your surgeon that you may do so.  DIET:  You may eat any foods that you can tolerate.  It is a good idea to eat a high fiber diet and take in plenty of fluids to prevent constipation.  If you do become constipated you may want to take a mild laxative or take ducolax tablets on a daily basis until your bowel habits are regular.  Constipation can be very uncomfortable, along with straining, after recent surgery.  ACTIVITY:  You are encouraged to walk and engage in light activity for the next two weeks.  You should not lift more than 20 pounds for 4-6 weeks after surgery as it could put you at increased risk for complications.  Twenty pounds is roughly equivalent to a plastic bag of groceries. At that time- Listen to your body when lifting, if you have pain when lifting, stop and then try again in a few days. Soreness  after doing exercises or activities of daily living is normal as you get back in to your normal routine.  SUNBLOCK Use sun block to incision area over the next year if this area will be exposed to sun. This helps decrease scarring and will allow you avoid a permanent darkened area over your incision.  QUESTIONS:  Please feel free to call our office if you have any questions, and we will be glad to assist you

## 2020-03-17 ENCOUNTER — Ambulatory Visit: Payer: 59 | Admitting: Internal Medicine

## 2020-04-28 ENCOUNTER — Ambulatory Visit: Payer: 59 | Admitting: Internal Medicine

## 2021-08-11 ENCOUNTER — Encounter: Payer: 59 | Admitting: Internal Medicine

## 2021-08-21 ENCOUNTER — Ambulatory Visit: Payer: 59 | Admitting: Family Medicine
